# Patient Record
Sex: Female | Born: 1970 | Race: Black or African American | Hispanic: No | Marital: Single | Smoking: Never smoker
Health system: Southern US, Community
[De-identification: ages and names within clinical notes are randomized; demographics above are authoritative.]

## PROBLEM LIST (undated history)

## (undated) DIAGNOSIS — G43009 Migraine without aura, not intractable, without status migrainosus: Principal | ICD-10-CM

## (undated) DIAGNOSIS — D649 Anemia, unspecified: Secondary | ICD-10-CM

## (undated) DIAGNOSIS — N809 Endometriosis, unspecified: Secondary | ICD-10-CM

## (undated) DIAGNOSIS — D259 Leiomyoma of uterus, unspecified: Secondary | ICD-10-CM

## (undated) HISTORY — DX: Migraine without aura, not intractable, without status migrainosus: G43.009

## (undated) HISTORY — DX: Endometriosis, unspecified: N80.9

## (undated) HISTORY — DX: Leiomyoma of uterus, unspecified: D25.9

## (undated) HISTORY — PX: HERNIA REPAIR: SHX51

## (undated) HISTORY — PX: ABDOMINAL EXPLORATION SURGERY: SHX538

---

## 2000-11-07 ENCOUNTER — Emergency Department (HOSPITAL_COMMUNITY): Admission: EM | Admit: 2000-11-07 | Discharge: 2000-11-07 | Payer: Self-pay | Admitting: Emergency Medicine

## 2000-11-07 ENCOUNTER — Encounter: Payer: Self-pay | Admitting: Emergency Medicine

## 2001-04-26 ENCOUNTER — Encounter: Payer: Self-pay | Admitting: Otolaryngology

## 2001-04-26 ENCOUNTER — Ambulatory Visit (HOSPITAL_COMMUNITY): Admission: RE | Admit: 2001-04-26 | Discharge: 2001-04-26 | Payer: Self-pay | Admitting: Otolaryngology

## 2001-10-06 ENCOUNTER — Other Ambulatory Visit: Admission: RE | Admit: 2001-10-06 | Discharge: 2001-10-06 | Payer: Self-pay | Admitting: Obstetrics and Gynecology

## 2002-03-01 ENCOUNTER — Ambulatory Visit (HOSPITAL_COMMUNITY): Admission: RE | Admit: 2002-03-01 | Discharge: 2002-03-01 | Payer: Self-pay | Admitting: Obstetrics and Gynecology

## 2003-03-04 ENCOUNTER — Other Ambulatory Visit: Admission: RE | Admit: 2003-03-04 | Discharge: 2003-03-04 | Payer: Self-pay | Admitting: Obstetrics and Gynecology

## 2010-12-02 ENCOUNTER — Other Ambulatory Visit: Payer: Self-pay | Admitting: Family Medicine

## 2010-12-02 DIAGNOSIS — Z1231 Encounter for screening mammogram for malignant neoplasm of breast: Secondary | ICD-10-CM

## 2010-12-25 ENCOUNTER — Ambulatory Visit
Admission: RE | Admit: 2010-12-25 | Discharge: 2010-12-25 | Disposition: A | Discharge status: Home / Self Care | Payer: Medicaid Other | Source: Ambulatory Visit | Attending: Family Medicine | Admitting: Family Medicine

## 2010-12-25 DIAGNOSIS — Z1231 Encounter for screening mammogram for malignant neoplasm of breast: Secondary | ICD-10-CM

## 2011-03-16 HISTORY — PX: KNEE ARTHROSCOPY: SUR90

## 2011-04-22 ENCOUNTER — Ambulatory Visit: Payer: PRIVATE HEALTH INSURANCE | Attending: Orthopedic Surgery | Admitting: Physical Therapy

## 2011-04-22 DIAGNOSIS — M25569 Pain in unspecified knee: Secondary | ICD-10-CM | POA: Insufficient documentation

## 2011-04-22 DIAGNOSIS — IMO0001 Reserved for inherently not codable concepts without codable children: Secondary | ICD-10-CM | POA: Insufficient documentation

## 2011-04-22 DIAGNOSIS — M25669 Stiffness of unspecified knee, not elsewhere classified: Secondary | ICD-10-CM | POA: Insufficient documentation

## 2011-04-27 ENCOUNTER — Encounter: Payer: PRIVATE HEALTH INSURANCE | Admitting: Rehabilitation

## 2011-05-04 ENCOUNTER — Ambulatory Visit: Payer: PRIVATE HEALTH INSURANCE | Admitting: Rehabilitation

## 2011-05-06 ENCOUNTER — Ambulatory Visit: Payer: PRIVATE HEALTH INSURANCE | Admitting: Rehabilitation

## 2011-05-10 ENCOUNTER — Ambulatory Visit: Payer: PRIVATE HEALTH INSURANCE | Admitting: Rehabilitation

## 2011-05-12 ENCOUNTER — Encounter: Payer: PRIVATE HEALTH INSURANCE | Admitting: Rehabilitation

## 2011-05-14 ENCOUNTER — Ambulatory Visit: Payer: PRIVATE HEALTH INSURANCE | Attending: Orthopedic Surgery | Admitting: Physical Therapy

## 2011-05-14 DIAGNOSIS — M25669 Stiffness of unspecified knee, not elsewhere classified: Secondary | ICD-10-CM | POA: Insufficient documentation

## 2011-05-14 DIAGNOSIS — IMO0001 Reserved for inherently not codable concepts without codable children: Secondary | ICD-10-CM | POA: Insufficient documentation

## 2011-05-14 DIAGNOSIS — M25569 Pain in unspecified knee: Secondary | ICD-10-CM | POA: Insufficient documentation

## 2011-05-24 ENCOUNTER — Ambulatory Visit: Payer: PRIVATE HEALTH INSURANCE | Admitting: Physical Therapy

## 2011-05-27 ENCOUNTER — Ambulatory Visit: Payer: PRIVATE HEALTH INSURANCE | Admitting: Physical Therapy

## 2011-06-01 ENCOUNTER — Ambulatory Visit: Payer: PRIVATE HEALTH INSURANCE | Admitting: Rehabilitation

## 2011-06-04 ENCOUNTER — Ambulatory Visit: Payer: PRIVATE HEALTH INSURANCE | Admitting: Physical Therapy

## 2011-06-08 ENCOUNTER — Encounter: Payer: PRIVATE HEALTH INSURANCE | Admitting: Rehabilitation

## 2011-06-09 ENCOUNTER — Ambulatory Visit: Payer: PRIVATE HEALTH INSURANCE | Admitting: Rehabilitation

## 2011-06-10 ENCOUNTER — Encounter: Payer: PRIVATE HEALTH INSURANCE | Admitting: Rehabilitation

## 2011-06-11 ENCOUNTER — Encounter: Payer: PRIVATE HEALTH INSURANCE | Admitting: Physical Therapy

## 2011-06-14 ENCOUNTER — Ambulatory Visit: Payer: PRIVATE HEALTH INSURANCE | Attending: Orthopedic Surgery | Admitting: Rehabilitation

## 2011-06-14 DIAGNOSIS — M25569 Pain in unspecified knee: Secondary | ICD-10-CM | POA: Insufficient documentation

## 2011-06-14 DIAGNOSIS — IMO0001 Reserved for inherently not codable concepts without codable children: Secondary | ICD-10-CM | POA: Insufficient documentation

## 2011-06-14 DIAGNOSIS — M25669 Stiffness of unspecified knee, not elsewhere classified: Secondary | ICD-10-CM | POA: Insufficient documentation

## 2011-06-16 ENCOUNTER — Encounter: Payer: PRIVATE HEALTH INSURANCE | Admitting: Rehabilitation

## 2011-06-21 ENCOUNTER — Ambulatory Visit: Payer: PRIVATE HEALTH INSURANCE | Admitting: Physical Therapy

## 2011-06-23 ENCOUNTER — Encounter: Payer: PRIVATE HEALTH INSURANCE | Admitting: Rehabilitation

## 2011-06-25 ENCOUNTER — Encounter: Payer: PRIVATE HEALTH INSURANCE | Admitting: Physical Therapy

## 2011-07-02 ENCOUNTER — Ambulatory Visit: Payer: PRIVATE HEALTH INSURANCE | Admitting: Physical Therapy

## 2012-08-03 ENCOUNTER — Other Ambulatory Visit: Payer: Self-pay | Admitting: Physician Assistant

## 2012-08-25 DIAGNOSIS — J302 Other seasonal allergic rhinitis: Secondary | ICD-10-CM | POA: Insufficient documentation

## 2012-09-06 ENCOUNTER — Other Ambulatory Visit: Payer: Self-pay | Admitting: Obstetrics and Gynecology

## 2012-09-06 DIAGNOSIS — Z1231 Encounter for screening mammogram for malignant neoplasm of breast: Secondary | ICD-10-CM

## 2012-09-06 DIAGNOSIS — Z8742 Personal history of other diseases of the female genital tract: Secondary | ICD-10-CM | POA: Insufficient documentation

## 2012-09-19 ENCOUNTER — Ambulatory Visit
Admission: RE | Admit: 2012-09-19 | Discharge: 2012-09-19 | Disposition: A | Discharge status: Home / Self Care | Payer: Medicaid Other | Source: Ambulatory Visit | Attending: Obstetrics and Gynecology | Admitting: Obstetrics and Gynecology

## 2012-09-19 DIAGNOSIS — Z1231 Encounter for screening mammogram for malignant neoplasm of breast: Secondary | ICD-10-CM

## 2013-03-01 ENCOUNTER — Emergency Department (HOSPITAL_COMMUNITY)
Admission: EM | Admit: 2013-03-01 | Discharge: 2013-03-01 | Disposition: A | Discharge status: Home / Self Care | Payer: Medicaid Other | Source: Home / Self Care | Attending: Family Medicine | Admitting: Family Medicine

## 2013-03-01 ENCOUNTER — Encounter (HOSPITAL_COMMUNITY): Payer: Self-pay | Admitting: Emergency Medicine

## 2013-03-01 DIAGNOSIS — M25531 Pain in right wrist: Secondary | ICD-10-CM

## 2013-03-01 DIAGNOSIS — M25539 Pain in unspecified wrist: Secondary | ICD-10-CM

## 2013-03-01 MED ORDER — PREDNISONE 10 MG PO KIT: PACK | ORAL | Status: DC

## 2013-03-01 MED ORDER — TRAMADOL HCL 50 MG PO TABS: 50.0000 mg | ORAL_TABLET | Freq: Four times a day (QID) | ORAL | Status: DC | PRN

## 2013-03-01 NOTE — ED Notes (Signed)
Pt  Reports  Pain   r       Hand        X      1  Day   -  Went  To  A  Provider  yest  And  Was  RX  meloxicam         -  She   denys   specefic  Injury  The  Hand  Is  Slightly  Swollen    And  Seems  Slightly  Red

## 2013-03-01 NOTE — ED Provider Notes (Signed)
CSN: [id number]     Arrival date & time 03/01/13  7829 History   First MD Initiated Contact with Patient 03/01/13 0901     Chief Complaint  Patient presents with  . Hand Problem   (Consider location/radiation/quality/duration/timing/severity/associated sxs/prior Treatment) HPI Comments: Robin Blackwell is a 42 year old female who presents for evaluation of pain, redness, and swelling in the dorsum of her right hand and wrist. This began 2 days ago. She woke up with pain in the wrist today after performing an exercise that involved her spending a prolonged amount of time in the pushup position. The area is swollen and very tender, and the pain is significantly increased with any motion of the hand or wrist. She also has some tingling sensation proximally and distally. She was seen and [facility] urgent [facility] yesterday and was prescribed meloxicam, this does not seem to be helping at all. She had an x-ray that was negative. She has never had this problem before. She denies any history of arthritis, and no immediate family members with rheumatologic diseases. She has a possible history of a ganglion cyst in this area that now hurts. No fever, chills, NVD, or any other systemic symptoms at this time.   History reviewed. No pertinent past medical history. History reviewed. No pertinent past surgical history. History reviewed. No pertinent family history. History  Substance Use Topics  . Smoking status: Never Smoker   . Smokeless tobacco: Not on file  . Alcohol Use: No   OB History   Grav Para Term Preterm Abortions TAB SAB Ect Mult Living                 Review of Systems  Constitutional: Negative for fever and chills.  Eyes: Negative for visual disturbance.  Respiratory: Negative for cough and shortness of breath.   Cardiovascular: Negative for chest pain, palpitations and leg swelling.  Gastrointestinal: Negative for nausea, vomiting and abdominal pain.  Endocrine: Negative for polydipsia and  polyuria.  Genitourinary: Negative for dysuria, urgency and frequency.  Musculoskeletal:       See history of present illness  Skin: Negative for rash.  Neurological: Negative for dizziness, weakness and light-headedness.    Allergies  Review of patient's allergies indicates no known allergies.  Home Medications   Current Outpatient Rx  Name  Route  Sig  Dispense  Refill  . MELOXICAM PO   Oral   Take by mouth.         . PredniSONE 10 MG KIT      12 day taper dose pack. Use as directed   48 each   0   . traMADol (ULTRAM) 50 MG tablet   Oral   Take 1 tablet (50 mg total) by mouth every 6 (six) hours as needed.   20 tablet   0    BP 120/76  Pulse 72  Temp(Src) 98.6 F (37 C) (Oral)  Resp 18  SpO2 100%  LMP 02/09/2013 Physical Exam  Nursing note and vitals reviewed. Constitutional: She is oriented to person, place, and time. Vital signs are normal. She appears well-developed and well-nourished. No distress.  HENT:  Head: Normocephalic and atraumatic.  Pulmonary/Chest: Effort normal. No respiratory distress.  Musculoskeletal:       Right wrist: She exhibits decreased range of motion (severe pain on the dorsum of the wrist with flexion of the wrist) and swelling.       Right hand: She exhibits decreased range of motion, tenderness and swelling. She exhibits normal two-point  discrimination. Normal sensation noted. Decreased strength (due to pain) noted.       Hands: Neurological: She is alert and oriented to person, place, and time. She has normal strength. Coordination normal.  Skin: Skin is warm and dry. No rash noted. She is not diaphoretic.  Psychiatric: She has a normal mood and affect. Judgment normal.    ED Course  Procedures (including critical care time) Labs Review Labs Reviewed - No data to display Imaging Review No results found.  Ultrasound done by Dr. Denyse Amass did not reveal any obvious cause of this patient's pain  MDM   1. Wrist pain, acute,  right    Probable overuse injury of the wrist. Treating with prednisone, immobilization, tramadol, when necessary Tylenol. Followup with Dr. Katrinka Blazing[facility]   Meds ordered this encounter  Medications  . PredniSONE 10 MG KIT    Sig: 12 day taper dose pack. Use as directed    Dispense:  48 each    Refill:  0    Order Specific Question:  Supervising Provider    Answer:  Clementeen Graham, S [[id number]]  . traMADol (ULTRAM) 50 MG tablet    Sig: Take 1 tablet (50 mg total) by mouth every 6 (six) hours as needed.    Dispense:  20 tablet    Refill:  0    Order Specific Question:  Supervising Provider    Answer:  Clementeen Graham, Kathie Rhodes [[id number]]       Graylon Good, PA-C 03/01/13 [phone]

## 2013-03-01 NOTE — ED Notes (Signed)
Med  r  Thumb  Spica   applied 

## 2013-03-03 NOTE — ED Provider Notes (Signed)
Medical screening examination/treatment/procedure(s) were performed by a resident physician or non-physician practitioner and as the supervising physician I was immediately available for consultation/collaboration.  Clementeen Graham, MD    Rodolph Bong, MD 03/03/13 [phone]

## 2013-08-30 ENCOUNTER — Other Ambulatory Visit: Payer: Self-pay

## 2013-08-30 DIAGNOSIS — Z1231 Encounter for screening mammogram for malignant neoplasm of breast: Secondary | ICD-10-CM

## 2013-09-07 ENCOUNTER — Emergency Department (HOSPITAL_COMMUNITY): Payer: Medicaid Other

## 2013-09-07 ENCOUNTER — Encounter (HOSPITAL_COMMUNITY): Payer: Self-pay | Admitting: Emergency Medicine

## 2013-09-07 ENCOUNTER — Emergency Department (HOSPITAL_COMMUNITY)
Admission: EM | Admit: 2013-09-07 | Discharge: 2013-09-07 | Disposition: A | Discharge status: Home / Self Care | Payer: Self-pay | Attending: Emergency Medicine | Admitting: Emergency Medicine

## 2013-09-07 DIAGNOSIS — Z8679 Personal history of other diseases of the circulatory system: Secondary | ICD-10-CM | POA: Insufficient documentation

## 2013-09-07 DIAGNOSIS — Z3202 Encounter for pregnancy test, result negative: Secondary | ICD-10-CM | POA: Insufficient documentation

## 2013-09-07 DIAGNOSIS — R51 Headache: Secondary | ICD-10-CM | POA: Insufficient documentation

## 2013-09-07 DIAGNOSIS — R519 Headache, unspecified: Secondary | ICD-10-CM

## 2013-09-07 DIAGNOSIS — M62838 Other muscle spasm: Secondary | ICD-10-CM | POA: Insufficient documentation

## 2013-09-07 DIAGNOSIS — IMO0002 Reserved for concepts with insufficient information to code with codable children: Secondary | ICD-10-CM | POA: Insufficient documentation

## 2013-09-07 DIAGNOSIS — R110 Nausea: Secondary | ICD-10-CM | POA: Insufficient documentation

## 2013-09-07 DIAGNOSIS — H53149 Visual discomfort, unspecified: Secondary | ICD-10-CM | POA: Insufficient documentation

## 2013-09-07 DIAGNOSIS — M542 Cervicalgia: Secondary | ICD-10-CM | POA: Insufficient documentation

## 2013-09-07 LAB — I-STAT CHEM 8, ED
BUN: 13 mg/dL (ref 6–23)
Calcium, Ion: 1.15 mmol/L (ref 1.12–1.23)
Chloride: 113 mEq/L — ABNORMAL HIGH (ref 96–112)
Creatinine, Ser: 0.8 mg/dL (ref 0.50–1.10)
Glucose, Bld: 74 mg/dL (ref 70–99)
HCT: 38 % (ref 36.0–46.0)
Hemoglobin: 12.9 g/dL (ref 12.0–15.0)
Potassium: 3.3 mEq/L — ABNORMAL LOW (ref 3.7–5.3)
Sodium: 138 mEq/L (ref 137–147)
TCO2: 26 mmol/L (ref 0–100)

## 2013-09-07 LAB — CBC WITH DIFFERENTIAL/PLATELET
Basophils Absolute: 0 10*3/uL (ref 0.0–0.1)
Basophils Relative: 1 % (ref 0–1)
Eosinophils Absolute: 0.1 10*3/uL (ref 0.0–0.7)
Eosinophils Relative: 2 % (ref 0–5)
HCT: 38.5 % (ref 36.0–46.0)
Hemoglobin: 12.4 g/dL (ref 12.0–15.0)
Lymphocytes Relative: 29 % (ref 12–46)
Lymphs Abs: 1.7 10*3/uL (ref 0.7–4.0)
MCH: 30.4 pg (ref 26.0–34.0)
MCHC: 32.2 g/dL (ref 30.0–36.0)
MCV: 94.4 fL (ref 78.0–100.0)
Monocytes Absolute: 0.4 10*3/uL (ref 0.1–1.0)
Monocytes Relative: 6 % (ref 3–12)
Neutro Abs: 3.6 10*3/uL (ref 1.7–7.7)
Neutrophils Relative %: 62 % (ref 43–77)
Platelets: 203 10*3/uL (ref 150–400)
RBC: 4.08 MIL/uL (ref 3.87–5.11)
RDW: 13.3 % (ref 11.5–15.5)
WBC: 5.8 10*3/uL (ref 4.0–10.5)

## 2013-09-07 LAB — I-STAT CG4 LACTIC ACID, ED: Lactic Acid, Venous: 1.33 mmol/L (ref 0.5–2.2)

## 2013-09-07 LAB — POC URINE PREG, ED: Preg Test, Ur: NEGATIVE

## 2013-09-07 MED ORDER — IOHEXOL 350 MG/ML SOLN
80.0000 mL | Freq: Once | INTRAVENOUS | Status: AC | PRN
  Administered 2013-09-07: 80 mL via INTRAVENOUS

## 2013-09-07 MED ORDER — HYDROMORPHONE HCL PF 1 MG/ML IJ SOLN
1.0000 mg | Freq: Once | INTRAMUSCULAR | Status: AC
  Administered 2013-09-07: 1 mg via INTRAVENOUS
  Filled 2013-09-07: qty 1

## 2013-09-07 MED ORDER — METOCLOPRAMIDE HCL 5 MG/ML IJ SOLN
10.0000 mg | Freq: Once | INTRAMUSCULAR | Status: AC
  Administered 2013-09-07: 10 mg via INTRAVENOUS
  Filled 2013-09-07: qty 2

## 2013-09-07 MED ORDER — OXYCODONE-ACETAMINOPHEN 5-325 MG PO TABS: 1.0000 | ORAL_TABLET | ORAL | Status: DC | PRN

## 2013-09-07 MED ORDER — ONDANSETRON HCL 4 MG/2ML IJ SOLN
4.0000 mg | Freq: Once | INTRAMUSCULAR | Status: AC
  Administered 2013-09-07: 4 mg via INTRAVENOUS
  Filled 2013-09-07: qty 2

## 2013-09-07 MED ORDER — DIPHENHYDRAMINE HCL 50 MG/ML IJ SOLN
25.0000 mg | Freq: Once | INTRAMUSCULAR | Status: AC
  Administered 2013-09-07: 25 mg via INTRAVENOUS
  Filled 2013-09-07: qty 1

## 2013-09-07 MED ORDER — DIAZEPAM 5 MG PO TABS: 5.0000 mg | ORAL_TABLET | Freq: Three times a day (TID) | ORAL | Status: DC | PRN

## 2013-09-07 MED ORDER — LIDOCAINE 5 % EX PTCH
2.0000 | MEDICATED_PATCH | CUTANEOUS | Status: DC
  Administered 2013-09-07: 2 via TRANSDERMAL
  Filled 2013-09-07: qty 2

## 2013-09-07 MED ORDER — DIAZEPAM 5 MG/ML IJ SOLN
5.0000 mg | Freq: Once | INTRAMUSCULAR | Status: AC
  Administered 2013-09-07: 5 mg via INTRAVENOUS
  Filled 2013-09-07: qty 2

## 2013-09-07 MED ORDER — ONDANSETRON 4 MG PO TBDP: 4.0000 mg | ORAL_TABLET | Freq: Once | ORAL | Status: DC

## 2013-09-07 MED ORDER — LIDOCAINE 5 % EX PTCH: 1.0000 | MEDICATED_PATCH | CUTANEOUS | Status: DC

## 2013-09-07 MED ORDER — IBUPROFEN 600 MG PO TABS: 600.0000 mg | ORAL_TABLET | Freq: Four times a day (QID) | ORAL | Status: DC | PRN

## 2013-09-07 NOTE — ED Notes (Signed)
Reviewed discharge instructionswith patient and family. Pt. Denies questions and further needs at this time.

## 2013-09-07 NOTE — Discharge Instructions (Signed)
Please follow up with your primary care physician in 1-2 days. If you do not have one please call the [place] number listed above. Please follow up with Dr. Jannifer Franklin to schedule a follow up appointment.  Please take pain medication and/or muscle relaxants as prescribed and as needed for pain. Please do not drive on narcotic pain medication or on muscle relaxants. Please return if you develop a fever > 100.62F, nausea, vomiting, lethargy, slurred speech, or other concerning signs or symptoms. Please read all discharge instructions and return precautions.   General Headache Without Cause A headache is pain or discomfort felt around the head or neck area. The specific cause of a headache may not be found. There are many causes and types of headaches. A few common ones are:  Tension headaches.  Migraine headaches.  Cluster headaches.  Chronic daily headaches. HOME CARE INSTRUCTIONS   Keep all follow-up appointments with your caregiver or any specialist referral.  Only take over-the-counter or prescription medicines for pain or discomfort as directed by your caregiver.  Lie down in a dark, quiet room when you have a headache.  Keep a headache journal to find out what may trigger your migraine headaches. For example, write down:  What you eat and drink.  How much sleep you get.  Any change to your diet or medicines.  Try massage or other relaxation techniques.  Put ice packs or heat on the head and neck. Use these 3 to 4 times per day for 15 to 20 minutes each time, or as needed.  Limit stress.  Sit up straight, and do not tense your muscles.  Quit smoking if you smoke.  Limit alcohol use.  Decrease the amount of caffeine you drink, or stop drinking caffeine.  Eat and sleep on a regular schedule.  Get 7 to 9 hours of sleep, or as recommended by your caregiver.  Keep lights dim if bright lights bother you and make your headaches worse. SEEK MEDICAL CARE  IF:   You have problems with the medicines you were prescribed.  Your medicines are not working.  You have a change from the usual headache.  You have nausea or vomiting. SEEK IMMEDIATE MEDICAL CARE IF:   Your headache becomes severe.  You have a fever.  You have a stiff neck.  You have loss of vision.  You have muscular weakness or loss of muscle control.  You start losing your balance or have trouble walking.  You feel faint or pass out.  You have severe symptoms that are different from your first symptoms. MAKE SURE YOU:   Understand these instructions.  Will watch your condition.  Will get help right away if you are not doing well or get worse. Document Released: 03/01/2005 Document Revised: 05/24/2011 Document Reviewed: 03/17/2011 [facility] Patient Information 2015 [place], [place]. This information is not intended to replace advice given to you by your health care provider. Make sure you discuss any questions you have with your health care provider.   Headaches, Frequently Asked Questions MIGRAINE HEADACHES Q: What is migraine? What causes it? How can I treat it? A: Generally, migraine headaches begin as a dull ache. Then they develop into a constant, throbbing, and pulsating pain. You may experience pain at the temples. You may experience pain at the front or back of one or both sides of the head. The pain is usually accompanied by a combination of:  Nausea.  Vomiting.  Sensitivity to light and noise. Some people (  about 15%) experience an aura (see below) before an attack. The cause of migraine is believed to be chemical reactions in the brain. Treatment for migraine may include over-the-counter or prescription medications. It may also include self-help techniques. These include relaxation training and biofeedback.  Q: What is an aura? A: About 15% of people with migraine get an "aura". This is a sign of neurological symptoms that occur before a migraine  headache. You may see wavy or jagged lines, dots, or flashing lights. You might experience tunnel vision or blind spots in one or both eyes. The aura can include visual or auditory hallucinations (something imagined). It may include disruptions in smell (such as strange odors), taste or touch. Other symptoms include:  Numbness.  A "pins and needles" sensation.  Difficulty in recalling or speaking the correct word. These neurological events may last as long as 60 minutes. These symptoms will fade as the headache begins. Q: What is a trigger? A: Certain physical or environmental factors can lead to or "trigger" a migraine. These include:  Foods.  Hormonal changes.  Weather.  Stress. It is important to remember that triggers are different for everyone. To help prevent migraine attacks, you need to figure out which triggers affect you. Keep a headache diary. This is a good way to track triggers. The diary will help you talk to your healthcare professional about your condition. Q: Does weather affect migraines? A: Bright sunshine, hot, humid conditions, and drastic changes in barometric pressure may lead to, or "trigger," a migraine attack in some people. But studies have shown that weather does not act as a trigger for everyone with migraines. Q: What is the link between migraine and hormones? A: Hormones start and regulate many of your body's functions. Hormones keep your body in balance within a constantly changing environment. The levels of hormones in your body are unbalanced at times. Examples are during menstruation, pregnancy, or menopause. That can lead to a migraine attack. In fact, about three quarters of all women with migraine report that their attacks are related to the menstrual cycle.  Q: Is there an increased risk of stroke for migraine sufferers? A: The likelihood of a migraine attack causing a stroke is very remote. That is not to say that migraine sufferers cannot have a stroke  associated with their migraines. In persons under age 68, the most common associated factor for stroke is migraine headache. But over the course of a person's normal life span, the occurrence of migraine headache may actually be associated with a reduced risk of dying from cerebrovascular disease due to stroke.  Q: What are acute medications for migraine? A: Acute medications are used to treat the pain of the headache after it has started. Examples over-the-counter medications, NSAIDs, ergots, and triptans.  Q: What are the triptans? A: Triptans are the newest class of abortive medications. They are specifically targeted to treat migraine. Triptans are vasoconstrictors. They moderate some chemical reactions in the brain. The triptans work on receptors in your brain. Triptans help to restore the balance of a neurotransmitter called serotonin. Fluctuations in levels of serotonin are thought to be a main cause of migraine.  Q: Are over-the-counter medications for migraine effective? A: Over-the-counter, or "OTC," medications may be effective in relieving mild to moderate pain and associated symptoms of migraine. But you should see your caregiver before beginning any treatment regimen for migraine.  Q: What are preventive medications for migraine? A: Preventive medications for migraine are sometimes referred to as "  prophylactic" treatments. They are used to reduce the frequency, severity, and length of migraine attacks. Examples of preventive medications include antiepileptic medications, antidepressants, beta-blockers, calcium channel blockers, and NSAIDs (nonsteroidal anti-inflammatory drugs). Q: Why are anticonvulsants used to treat migraine? A: During the past few years, there has been an increased interest in antiepileptic drugs for the prevention of migraine. They are sometimes referred to as "anticonvulsants". Both epilepsy and migraine may be caused by similar reactions in the brain.  Q: Why are  antidepressants used to treat migraine? A: Antidepressants are typically used to treat people with depression. They may reduce migraine frequency by regulating chemical levels, such as serotonin, in the brain.  Q: What alternative therapies are used to treat migraine? A: The term "alternative therapies" is often used to describe treatments considered outside the scope of conventional Western medicine. Examples of alternative therapy include acupuncture, acupressure, and yoga. Another common alternative treatment is herbal therapy. Some herbs are believed to relieve headache pain. Always discuss alternative therapies with your caregiver before proceeding. Some herbal products contain arsenic and other toxins. TENSION HEADACHES Q: What is a tension-type headache? What causes it? How can I treat it? A: Tension-type headaches occur randomly. They are often the result of temporary stress, anxiety, fatigue, or anger. Symptoms include soreness in your temples, a tightening band-like sensation around your head (a "vice-like" ache). Symptoms can also include a pulling feeling, pressure sensations, and contracting head and neck muscles. The headache begins in your forehead, temples, or the back of your head and neck. Treatment for tension-type headache may include over-the-counter or prescription medications. Treatment may also include self-help techniques such as relaxation training and biofeedback. CLUSTER HEADACHES Q: What is a cluster headache? What causes it? How can I treat it? A: Cluster headache gets its name because the attacks come in groups. The pain arrives with little, if any, warning. It is usually on one side of the head. A tearing or bloodshot eye and a runny nose on the same side of the headache may also accompany the pain. Cluster headaches are believed to be caused by chemical reactions in the brain. They have been described as the most severe and intense of any headache type. Treatment for cluster  headache includes prescription medication and oxygen. SINUS HEADACHES Q: What is a sinus headache? What causes it? How can I treat it? A: When a cavity in the bones of the face and skull (a sinus) becomes inflamed, the inflammation will cause localized pain. This condition is usually the result of an allergic reaction, a tumor, or an infection. If your headache is caused by a sinus blockage, such as an infection, you will probably have a fever. An x-ray will confirm a sinus blockage. Your caregiver's treatment might include antibiotics for the infection, as well as antihistamines or decongestants.  REBOUND HEADACHES Q: What is a rebound headache? What causes it? How can I treat it? A: A pattern of taking acute headache medications too often can lead to a condition known as "rebound headache." A pattern of taking too much headache medication includes taking it more than 2 days per week or in excessive amounts. That means more than the label or a caregiver advises. With rebound headaches, your medications not only stop relieving pain, they actually begin to cause headaches. Doctors treat rebound headache by tapering the medication that is being overused. Sometimes your caregiver will gradually substitute a different type of treatment or medication. Stopping may be a challenge. Regularly overusing a medication  increases the potential for serious side effects. Consult a caregiver if you regularly use headache medications more than 2 days per week or more than the label advises. ADDITIONAL QUESTIONS AND ANSWERS Q: What is biofeedback? A: Biofeedback is a self-help treatment. Biofeedback uses special equipment to monitor your body's involuntary physical responses. Biofeedback monitors:  Breathing.  Pulse.  Heart rate.  Temperature.  Muscle tension.  Brain activity. Biofeedback helps you refine and perfect your relaxation exercises. You learn to control the physical responses that are related to  stress. Once the technique has been mastered, you do not need the equipment any more. Q: Are headaches hereditary? A: Four out of five (80%) of people that suffer report a family history of migraine. Scientists are not sure if this is genetic or a family predisposition. Despite the uncertainty, a child has a 50% chance of having migraine if one parent suffers. The child has a 75% chance if both parents suffer.  Q: Can children get headaches? A: By the time they reach high school, most young people have experienced some type of headache. Many safe and effective approaches or medications can prevent a headache from occurring or stop it after it has begun.  Q: What type of doctor should I see to diagnose and treat my headache? A: Start with your primary caregiver. Discuss his or her experience and approach to headaches. Discuss methods of classification, diagnosis, and treatment. Your caregiver may decide to recommend you to a headache specialist, depending upon your symptoms or other physical conditions. Having diabetes, allergies, etc., may require a more comprehensive and inclusive approach to your headache. The National Head[employer] Foundation will provide, upon request, a list of [facility] physician members in your state. Document Released: 05/22/2003 Document Revised: 05/24/2011 Document Reviewed: 10/30/2007 [facility] Patient Information 2015 [place], [place]. This information is not intended to replace advice given to you by your health care provider. Make sure you discuss any questions you have with your health care provider.

## 2013-09-07 NOTE — ED Notes (Signed)
Headache for 2-3 weeks-- shoulder and neck pain, was seen at [facility] in [place], received 4 shots states with no relief. Dr. Jonni Sanger is primary care doc.

## 2013-09-07 NOTE — ED Provider Notes (Signed)
CSN: [id number]     Arrival date & time 09/07/13  3833 History   First MD Initiated Contact with Patient 09/07/13 1013     Chief Complaint  Patient presents with  . Headache  . Torticollis     (Consider location/radiation/quality/duration/timing/severity/associated sxs/prior Treatment) HPI Comments: Patient is a 43 yo F PMHx significant for migraines presenting to the ED for 2-3 week history of throbbing tight pain that began in the patient's right shoulder blade area and neck that has been gradually radiating up into the posterior head causing a headache. Patient states she has had a continued throbbing headache without relief. Patient was seen by her PCP 2 days ago and had a CT head w/o contrast performed without acute finding. She states she was given a shot of Benadryl and Stadol with no improvement. Was advised to come to the emergency department for continued headache. Patient has been seen by a chiropractor and receiving adjustments on her right shoulder and neck, last visit was Monday. Denies any fevers or chills. Does endorse associated nausea and photophobia.  Patient is a 43 y.o. female presenting with headaches.  Headache Associated symptoms: nausea and photophobia   Associated symptoms: no abdominal pain, no fever, no numbness and no vomiting     Past Medical History  Diagnosis Date  . Migraines     around period time   Past Surgical History  Procedure Laterality Date  . Knee arthroscopy  2013  . Hernia repair    . Abdominal exploration surgery     No family history on file. History  Substance Use Topics  . Smoking status: Never Smoker   . Smokeless tobacco: Not on file  . Alcohol Use: No   OB History   Grav Para Term Preterm Abortions TAB SAB Ect Mult Living                 Review of Systems  Constitutional: Negative for fever and chills.  Eyes: Positive for photophobia.  Respiratory: Negative for shortness of breath.   Cardiovascular: Negative for chest  pain.  Gastrointestinal: Positive for nausea. Negative for vomiting and abdominal pain.  Neurological: Positive for headaches. Negative for syncope, weakness and numbness.  All other systems reviewed and are negative.     Allergies  Review of patient's allergies indicates no known allergies.  Home Medications   Prior to Admission medications   Medication Sig Start Date End Date Taking? Authorizing Provider  diazepam (VALIUM) 5 MG tablet Take 1 tablet (5 mg total) by mouth every 8 (eight) hours as needed for muscle spasms. 09/07/13   Ian Castagna L Diannie Willner, PA-C  ibuprofen (ADVIL,MOTRIN) 600 MG tablet Take 1 tablet (600 mg total) by mouth every 6 (six) hours as needed. 09/07/13   Aqua Denslow L Vinie Charity, PA-C  lidocaine (LIDODERM) 5 % Place 1 patch onto the skin daily. Remove & Discard patch within 12 hours or as directed by MD 09/07/13   Stephani Police Arius Harnois, PA-C  MELOXICAM PO Take by mouth.    Historical Provider, MD  oxyCODONE-acetaminophen (PERCOCET/ROXICET) 5-325 MG per tablet Take 1 tablet by mouth every 4 (four) hours as needed for severe pain. May take 2 tablets PO q 6 hours for severe pain - Do not take with Tylenol as this tablet already contains tylenol 09/07/13   Rocko Fesperman L Namiko Pritts, PA-C  PredniSONE 10 MG KIT 12 day taper dose pack. Use as directed 03/01/13   Liam Graham, PA-C  traMADol (ULTRAM) 50 MG tablet Take 1 tablet (50  mg total) by mouth every 6 (six) hours as needed. 03/01/13   Freeman Caldron Baker, PA-C   BP 141/85  Pulse 71  Temp(Src) 98.1 F (36.7 C) (Oral)  Resp 17  SpO2 100%  LMP 08/20/2013 Physical Exam  Nursing note and vitals reviewed. Constitutional: She is oriented to person, place, and time. She appears well-developed and well-nourished. No distress.  HENT:  Head: Normocephalic and atraumatic.  Right Ear: Hearing, tympanic membrane, external ear and ear canal normal.  Left Ear: Hearing, tympanic membrane, external ear and ear canal normal.  Nose:  Nose normal.  Mouth/Throat: Uvula is midline, oropharynx is clear and moist and mucous membranes are normal. No oropharyngeal exudate.  Eyes: Conjunctivae and EOM are normal. Pupils are equal, round, and reactive to light.  Neck: Normal range of motion and full passive range of motion without pain. Neck supple. Muscular tenderness present. No spinous process tenderness present. No rigidity. No edema and no erythema present. No Brudzinski's sign and no Kernig's sign noted.    Cardiovascular: Normal rate, regular rhythm, normal heart sounds and intact distal pulses.   Pulmonary/Chest: Effort normal and breath sounds normal. No respiratory distress.  Abdominal: Soft. There is no tenderness.  Neurological: She is alert and oriented to person, place, and time. She has normal strength. No cranial nerve deficit. Gait normal. GCS eye subscore is 4. GCS verbal subscore is 5. GCS motor subscore is 6.  Sensation grossly intact.  No pronator drift.  Bilateral heel-knee-shin intact.  Skin: Skin is warm and dry. She is not diaphoretic.    ED Course  Procedures (including critical care time) Medications  lidocaine (LIDODERM) 5 % 2 patch (not administered)  ondansetron (ZOFRAN) injection 4 mg (4 mg Intravenous Given 09/07/13 1128)  diphenhydrAMINE (BENADRYL) injection 25 mg (25 mg Intravenous Given 09/07/13 1129)  metoCLOPramide (REGLAN) injection 10 mg (10 mg Intravenous Given 09/07/13 1130)  diazepam (VALIUM) injection 5 mg (5 mg Intravenous Given 09/07/13 1132)  HYDROmorphone (DILAUDID) injection 1 mg (1 mg Intravenous Given 09/07/13 1331)  iohexol (OMNIPAQUE) 350 MG/ML injection 80 mL (80 mLs Intravenous Contrast Given 09/07/13 1519)  HYDROmorphone (DILAUDID) injection 1 mg (1 mg Intravenous Given 09/07/13 1600)    Labs Review Labs Reviewed  I-STAT CHEM 8, ED - Abnormal; Notable for the following:    Potassium 3.3 (*)    Chloride 113 (*)    All other components within normal limits  CSF CULTURE    GRAM STAIN  CBC WITH DIFFERENTIAL  PROTIME-INR  CSF CELL COUNT WITH DIFFERENTIAL  CSF CELL COUNT WITH DIFFERENTIAL  PROTEIN AND GLUCOSE, CSF  I-STAT CG4 LACTIC ACID, ED  POC URINE PREG, ED    Imaging Review Ct Angio Head W/cm &/or Wo Cm  09/07/2013   CLINICAL DATA:  Headache for 2-3 weeks. Shoulder and neck pain. Evaluate for vascular lesion.  EXAM: CT ANGIOGRAPHY HEAD AND NECK  TECHNIQUE: Multidetector CT imaging of the head and neck was performed using the standard protocol during bolus administration of intravenous contrast. Multiplanar CT image reconstructions and MIPs were obtained to evaluate the vascular anatomy. Carotid stenosis measurements (when applicable) are obtained utilizing NASCET criteria, using the distal internal carotid diameter as the denominator.  CONTRAST:  34m OMNIPAQUE IOHEXOL 350 MG/ML SOLN  COMPARISON:  None.  FINDINGS: CTA HEAD FINDINGS  No evidence for acute infarction, hemorrhage, mass lesion, hydrocephalus, or extra-axial fluid. There is no atrophy or white matter disease. Post infusion, no abnormal enhancement of the brain or meninges. Calvarium intact. No  sinus or mastoid disease.  Internal carotid arteries are widely patent. No cavernous or supraclinoid stenosis. No skull base dissection.  Basilar artery widely patent with both vertebrals contributing, left dominant.  Unremarkable anterior, middle, and posterior cerebral arteries. No cerebellar branch occlusion. No visible intracranial aneurysm.  Major dural venous sinuses are patent.  Review of the MIP images confirms the above findings.  CTA NECK FINDINGS  Transverse arch demonstrates conventional branching of the great vessels without proximal stenosis. No visible aortic dissection or mediastinal mass.  The carotid bifurcations are widely patent without atherosclerosis or dissection. No fibromuscular disease.  Normal-appearing bilateral vertebral arteries with the left dominant. No ostial disease. No encroachment  by osseous spurs.  Subcentimeter right inferior thyroid lobe cyst without calcification. No internal jugular vein thrombus. No visible adenopathy. Major and minor salivary glands unremarkable. No sinus disease. No significant cervical spondylosis. No lung apex lesion. No body wall abnormality.  Review of the MIP images confirms the above findings.  IMPRESSION: No extracranial stenosis or dissection. No significant mediastinal, cervicothoracic spinal, or soft tissue neck abnormality.  Unremarkable brain and intracranial circulation. No acute or focal abnormality is detected. No flow-limiting stenosis or occlusion.  No extracranial cause for headache is observed.   Electronically Signed   By: Rolla Flatten M.D.   On: 09/07/2013 15:51   Ct Angio Neck W/cm &/or Wo/cm  09/07/2013   CLINICAL DATA:  Headache for 2-3 weeks. Shoulder and neck pain. Evaluate for vascular lesion.  EXAM: CT ANGIOGRAPHY HEAD AND NECK  TECHNIQUE: Multidetector CT imaging of the head and neck was performed using the standard protocol during bolus administration of intravenous contrast. Multiplanar CT image reconstructions and MIPs were obtained to evaluate the vascular anatomy. Carotid stenosis measurements (when applicable) are obtained utilizing NASCET criteria, using the distal internal carotid diameter as the denominator.  CONTRAST:  28m OMNIPAQUE IOHEXOL 350 MG/ML SOLN  COMPARISON:  None.  FINDINGS: CTA HEAD FINDINGS  No evidence for acute infarction, hemorrhage, mass lesion, hydrocephalus, or extra-axial fluid. There is no atrophy or white matter disease. Post infusion, no abnormal enhancement of the brain or meninges. Calvarium intact. No sinus or mastoid disease.  Internal carotid arteries are widely patent. No cavernous or supraclinoid stenosis. No skull base dissection.  Basilar artery widely patent with both vertebrals contributing, left dominant.  Unremarkable anterior, middle, and posterior cerebral arteries. No cerebellar branch  occlusion. No visible intracranial aneurysm.  Major dural venous sinuses are patent.  Review of the MIP images confirms the above findings.  CTA NECK FINDINGS  Transverse arch demonstrates conventional branching of the great vessels without proximal stenosis. No visible aortic dissection or mediastinal mass.  The carotid bifurcations are widely patent without atherosclerosis or dissection. No fibromuscular disease.  Normal-appearing bilateral vertebral arteries with the left dominant. No ostial disease. No encroachment by osseous spurs.  Subcentimeter right inferior thyroid lobe cyst without calcification. No internal jugular vein thrombus. No visible adenopathy. Major and minor salivary glands unremarkable. No sinus disease. No significant cervical spondylosis. No lung apex lesion. No body wall abnormality.  Review of the MIP images confirms the above findings.  IMPRESSION: No extracranial stenosis or dissection. No significant mediastinal, cervicothoracic spinal, or soft tissue neck abnormality.  Unremarkable brain and intracranial circulation. No acute or focal abnormality is detected. No flow-limiting stenosis or occlusion.  No extracranial cause for headache is observed.   Electronically Signed   By: JRolla FlattenM.D.   On: 09/07/2013 15:51     EKG Interpretation  None      MDM   Final diagnoses:  Nonintractable headache, unspecified chronicity pattern, unspecified headache type  Neck pain, bilateral posterior  Muscle spasms of neck    4:23 PM Discussed LP procedure with patient, discussed risks, benefits and actual procedure with the patient and her mother. Discussed a likely low suspicion for meningitis as patient is afebrile with full range of motion of neck, but discussed that we could not be 100% sure without a lumbar puncture. Had an extensive conversation regarding lumbar puncture with patient, her mother, Dr. Wyvonnia Dusky, and myself. The patient and the mother Acree to hold off on lumbar  puncture at this time, will return for any development of fever, mental status changes or other concerning signs or symptoms.  Filed Vitals:   09/07/13 1556  BP:   Pulse: 71  Temp:   Resp:    Afebrile, NAD, non-toxic appearing, AAOx4.  Pt HA treated and improved while in ED.  Non-concerning for [facility], ICH, Meningitis, or temporal arteritis. Pt is afebrile with no focal neuro deficits, nuchal rigidity, or change in vision. No neuro focal deficits on repeat examinations during stay in the emergency department. Lumbar puncture will be deferred at this time. CT scan is reviewed without any acute finding intracranial concern. Will treat patient as outpatient with symptom management along with neurology outpatient followup. Extensive return precautions were discussed with patient who agrees. Patient is stable at time of discharge Patient d/w with Dr. Wyvonnia Dusky, agrees with plan.    Harlow Mares, PA-C 09/07/13 1643

## 2013-09-07 NOTE — ED Provider Notes (Signed)
Medical screening examination/treatment/procedure(s) were conducted as a shared visit with non-physician practitioner(s) and myself.  I personally evaluated the patient during the encounter.  >3 week history of pain across R upper back and neck, radiating into head.  Denies trauma.  Has seen PCP and chiropractor, had negative CT. No fever, focal weakness, numbness, tingling.  Pain radiates from R neck, diffusely into head.  No vomiting. Patient's mother reports patient is doing better now than in previous weeks but still having pain. Smiling and nontoxic, afebrile.  TTP R paraspinal muscles.  No meningismus, neck supple. CN 2-12 intact, no ataxia on finger to nose, no nystagmus, 5/5 strength throughout, no pronator drift, Romberg negative, normal gait. Check CTA given recent chiropractor manipulation. LP discussed with patient and mother.  Patient has been having pain for >3 weeks. No fever, no meningismus, nonfocal neuro exam.  No leukocytosis.  Discussed with patient that meningitis seems unlikely given her prolonged course of symptoms and nontoxic appearance with normal neuro exam, no fever, no WBC count, no meningismus.  She and mother defer LP at this time and understands that meningitis cannot be 100% ruled out without it. She understands to return with fever, confusion, neuro deficit or any other concern.  BP 138/93  Pulse 83  Temp(Src) 98.1 F (36.7 C) (Oral)  Resp 19  SpO2 100%  LMP 08/20/2013      EKG Interpretation None       Ezequiel Essex, MD 09/07/13 2005

## 2013-09-07 NOTE — ED Notes (Signed)
Pt had been treated for right ear infection with amoxicillan, pain started in left shoulder, saw chiropractor approx 6 times receiving manipulation, and accupuncture. Last visit at chiropractor--Monday. Uses a tens machine on shoulder for pain, no relief,. Continues to have severe right ear pain, with throbbing.

## 2013-09-07 NOTE — ED Notes (Signed)
IV team attempted IV x 1 with ultrasound without success--

## 2013-09-07 NOTE — ED Notes (Signed)
Phlebotomy at bedside.

## 2013-09-20 ENCOUNTER — Ambulatory Visit
Admission: RE | Admit: 2013-09-20 | Discharge: 2013-09-20 | Disposition: A | Discharge status: Home / Self Care | Payer: BC Managed Care – PPO | Source: Ambulatory Visit

## 2013-09-20 DIAGNOSIS — Z1231 Encounter for screening mammogram for malignant neoplasm of breast: Secondary | ICD-10-CM

## 2013-09-25 ENCOUNTER — Encounter: Payer: Self-pay | Admitting: Neurology

## 2013-09-25 ENCOUNTER — Ambulatory Visit (INDEPENDENT_AMBULATORY_CARE_PROVIDER_SITE_OTHER): Payer: BC Managed Care – PPO | Admitting: Neurology

## 2013-09-25 VITALS — BP 122/85 | HR 73 | Ht 68.0 in | Wt 168.0 lb

## 2013-09-25 DIAGNOSIS — S139XXA Sprain of joints and ligaments of unspecified parts of neck, initial encounter: Secondary | ICD-10-CM | POA: Insufficient documentation

## 2013-09-25 DIAGNOSIS — G43009 Migraine without aura, not intractable, without status migrainosus: Secondary | ICD-10-CM

## 2013-09-25 HISTORY — DX: Migraine without aura, not intractable, without status migrainosus: G43.009

## 2013-09-25 NOTE — Patient Instructions (Addendum)
Topamax (topiramate) is a seizure medication that has an FDA approval for seizures and for migraine headache. Potential side effects of this medication include weight loss, cognitive slowing, tingling in the fingers and toes, and carbonated drinks will taste bad. If any significant side effects are noted on this drug, please contact our office.  Cervical Sprain A cervical sprain is an injury in the neck in which the strong, fibrous tissues (ligaments) that connect your neck bones stretch or tear. Cervical sprains can range from mild to severe. Severe cervical sprains can cause the neck vertebrae to be unstable. This can lead to damage of the spinal cord and can result in serious nervous system problems. The amount of time it takes for a cervical sprain to get better depends on the cause and extent of the injury. Most cervical sprains heal in 1 to 3 weeks. CAUSES  Severe cervical sprains may be caused by:   Contact sport injuries (such as from football, rugby, wrestling, hockey, auto racing, gymnastics, diving, martial arts, or boxing).   Motor vehicle collisions.   Whiplash injuries. This is an injury from a sudden forward and backward whipping movement of the head and neck.  Falls.  Mild cervical sprains may be caused by:   Being in an awkward position, such as while cradling a telephone between your ear and shoulder.   Sitting in a chair that does not offer proper support.   Working at a poorly designed computer station.   Looking up or down for long periods of time.  SYMPTOMS   Pain, soreness, stiffness, or a burning sensation in the front, back, or sides of the neck. This discomfort may develop immediately after the injury or slowly, 24 hours or more after the injury.   Pain or tenderness directly in the middle of the back of the neck.   Shoulder or upper back pain.   Limited ability to move the neck.   Headache.   Dizziness.   Weakness, numbness, or tingling  in the hands or arms.   Muscle spasms.   Difficulty swallowing or chewing.   Tenderness and swelling of the neck.  DIAGNOSIS  Most of the time your health care provider can diagnose a cervical sprain by taking your history and doing a physical exam. Your health care provider will ask about previous neck injuries and any known neck problems, such as arthritis in the neck. X-rays may be taken to find out if there are any other problems, such as with the bones of the neck. Other tests, such as a CT scan or MRI, may also be needed.  TREATMENT  Treatment depends on the severity of the cervical sprain. Mild sprains can be treated with rest, keeping the neck in place (immobilization), and pain medicines. Severe cervical sprains are immediately immobilized. Further treatment is done to help with pain, muscle spasms, and other symptoms and may include:  Medicines, such as pain relievers, numbing medicines, or muscle relaxants.   Physical therapy. This may involve stretching exercises, strengthening exercises, and posture training. Exercises and improved posture can help stabilize the neck, strengthen muscles, and help stop symptoms from returning.  HOME CARE INSTRUCTIONS   Put ice on the injured area.   Put ice in a plastic bag.   Place a towel between your skin and the bag.   Leave the ice on for 15-20 minutes, 3-4 times a day.   If your injury was severe, you may have been given a cervical collar to wear. A   cervical collar is a two-piece collar designed to keep your neck from moving while it heals.  Do not remove the collar unless instructed by your health care provider.  If you have long hair, keep it outside of the collar.  Ask your health care provider before making any adjustments to your collar. Minor adjustments may be required over time to improve comfort and reduce pressure on your chin or on the back of your head.  Ifyou are allowed to remove the collar for cleaning or  bathing, follow your health care provider's instructions on how to do so safely.  Keep your collar clean by wiping it with mild soap and water and drying it completely. If the collar you have been given includes removable pads, remove them every 1-2 days and hand wash them with soap and water. Allow them to air dry. They should be completely dry before you wear them in the collar.  If you are allowed to remove the collar for cleaning and bathing, wash and dry the skin of your neck. Check your skin for irritation or sores. If you see any, tell your health care provider.  Do not drive while wearing the collar.   Only take over-the-counter or prescription medicines for pain, discomfort, or fever as directed by your health care provider.   Keep all follow-up appointments as directed by your health care provider.   Keep all physical therapy appointments as directed by your health care provider.   Make any needed adjustments to your workstation to promote good posture.   Avoid positions and activities that make your symptoms worse.   Warm up and stretch before being active to help prevent problems.  SEEK MEDICAL CARE IF:   Your pain is not controlled with medicine.   You are unable to decrease your pain medicine over time as planned.   Your activity level is not improving as expected.  SEEK IMMEDIATE MEDICAL CARE IF:   You develop any bleeding.  You develop stomach upset.  You have signs of an allergic reaction to your medicine.   Your symptoms get worse.   You develop new, unexplained symptoms.   You have numbness, tingling, weakness, or paralysis in any part of your body.  MAKE SURE YOU:   Understand these instructions.  Will watch your condition.  Will get help right away if you are not doing well or get worse. Document Released: 12/27/2006 Document Revised: 03/06/2013 Document Reviewed: 09/06/2012 [employer] Patient Information 2015 [employer]. This  information is not intended to replace advice given to you by your health care provider. Make sure you discuss any questions you have with your health care provider.  

## 2013-09-25 NOTE — Progress Notes (Signed)
Reason for visit: Headache  Robin Blackwell is a 43 y.o. female  History of present illness:  Robin Blackwell is a 43 year old left-handed black female with a history of migraine headaches since age 89. The patient indicates that her headaches are menstrual associated and may occur 2 or 3 times a month around the time of the menstrual cycle. The headaches typically last about 2 hours and then resolve. The patient indicates that her headaches usually will start with sharp pains suddenly in the bitemporal regions, and may be associated with a throbbing pain. The patient will generally do not have phonophobia, but occasion she will have photophobia and some occasional nausea usually without vomiting. She will occasionally have some blurred vision with the headache. More recently, the patient began having headaches around the beginning of June 2015. The headaches are associated with left shoulder discomfort, now spreading to the right shoulder associated with some neck discomfort and pain in the back of the head primarily on the right. She has gone to the emergency room, and a CT scan of the head was done and was unremarkable. CT angiography of the head and neck were done and were unremarkable. The patient has been seen by a neurologist in the [place] area, and she has had the addition of Topamax, baclofen along with dry needle therapy of her right shoulder. This has helped some. The patient has taken Excedrin migraine without much benefit. She comes to this office for further evaluation. She does report some neck stiffness.  Past Medical History  Diagnosis Date  . Endometriosis   . Migraine without aura, without mention of intractable migraine without mention of status migrainosus 09/25/2013  . Uterine fibroid     Past Surgical History  Procedure Laterality Date  . Knee arthroscopy  2013    left  . Hernia repair      umbilical  . Abdominal exploration surgery      Family History  Problem  Relation Age of Onset  . Hypertension Mother   . Alcoholism Father   . Migraines Father   . Stroke Maternal Grandmother   . Breast cancer Paternal Grandmother   . Alcoholism Paternal Grandfather     Social history:  reports that she has never smoked. She has never used smokeless tobacco. She reports that she does not drink alcohol or use illicit drugs.  Medications:  No current outpatient prescriptions on file prior to visit.   No current facility-administered medications on file prior to visit.     No Known Allergies  ROS:  Out of a complete 14 system review of symptoms, the patient complains only of the following symptoms, and all other reviewed systems are negative.  Achy muscles Headache, dizziness  Blood pressure 122/85, pulse 73, height 5\' 8"  (1.727 m), weight 168 lb (76.204 kg), last menstrual period 08/20/2013.  Physical Exam  General: The patient is alert and cooperative at the time of the examination.  Eyes: Pupils are equal, round, and reactive to light. Discs are flat bilaterally.  Neck: The neck is supple, no carotid bruits are noted.  Respiratory: The respiratory examination is clear.  Cardiovascular: The cardiovascular examination reveals a regular rate and rhythm, no obvious murmurs or rubs are noted.  Neuromuscular: The patient lacks about 20 of full lateral rotation of the cervical spine bilaterally.  Skin: Extremities are without significant edema.  Neurologic Exam  Mental status: The patient is alert and oriented x 3 at the time of the examination. The patient has  apparent normal recent and remote memory, with an apparently normal attention span and concentration ability.  Cranial nerves: Facial symmetry is present. There is good sensation of the face to pinprick and soft touch bilaterally. The strength of the facial muscles and the muscles to head turning and shoulder shrug are normal bilaterally. Speech is well enunciated, no aphasia or  dysarthria is noted. Extraocular movements are full. Visual fields are full. The tongue is midline, and the patient has symmetric elevation of the soft palate. No obvious hearing deficits are noted.  Motor: The motor testing reveals 5 over 5 strength of all 4 extremities. Good symmetric motor tone is noted throughout.  Sensory: Sensory testing is intact to pinprick, soft touch, vibration sensation, and position sense on all 4 extremities. No evidence of extinction is noted.  Coordination: Cerebellar testing reveals good finger-nose-finger and heel-to-shin bilaterally.  Gait and station: Gait is normal. Tandem gait is normal. Romberg is negative. No drift is seen.  Reflexes: Deep tendon reflexes are symmetric and normal bilaterally. Toes are downgoing bilaterally.   CTA head and neck 09/07/13:  IMPRESSION:  No extracranial stenosis or dissection. No significant mediastinal,  cervicothoracic spinal, or soft tissue neck abnormality.  Unremarkable brain and intracranial circulation. No acute or focal  abnormality is detected. No flow-limiting stenosis or occlusion.  No extracranial cause for headache is observed.    Assessment/Plan:  1. Migraine headache, menstrual migraine  2. Cervical strain syndrome  The patient mainly is having pain currently associated with neuromuscular discomfort in the neck, right greater than left at this point. She does have to migraine headache, however. She has been placed on Topamax, currently at 25 mg at night. She is to go to 50 mg at night and eventually to 75 mg if she is tolerating the medication. She has been placed on baclofen, and I would recommend taking this on a scheduled basis at 5 mg 3 times daily. She will be sent for physical therapy for neuromuscular therapy on the neck and shoulders. She will followup through this office in 6-8 weeks.  Jill Alexanders MD 09/25/2013 9:13 PM  [facility] [address] [place], [place] [address]  Phone [phone] Fax [phone]

## 2013-11-07 ENCOUNTER — Ambulatory Visit: Payer: BC Managed Care – PPO | Admitting: Adult Health

## 2013-11-30 DIAGNOSIS — G44209 Tension-type headache, unspecified, not intractable: Secondary | ICD-10-CM | POA: Insufficient documentation

## 2014-10-10 ENCOUNTER — Encounter (HOSPITAL_BASED_OUTPATIENT_CLINIC_OR_DEPARTMENT_OTHER): Admission: RE | Payer: Self-pay | Source: Ambulatory Visit

## 2014-10-10 ENCOUNTER — Ambulatory Visit (HOSPITAL_BASED_OUTPATIENT_CLINIC_OR_DEPARTMENT_OTHER): Admission: RE | Admit: 2014-10-10 | Payer: Self-pay | Source: Ambulatory Visit | Admitting: Obstetrics and Gynecology

## 2014-10-10 SURGERY — LAPAROSCOPIC GELPORT ASSISTED MYOMECTOMY
Anesthesia: General

## 2014-12-29 ENCOUNTER — Encounter (HOSPITAL_COMMUNITY): Payer: Self-pay | Admitting: *Deleted

## 2014-12-29 ENCOUNTER — Emergency Department (HOSPITAL_COMMUNITY)
Admission: EM | Admit: 2014-12-29 | Discharge: 2014-12-29 | Disposition: A | Discharge status: Home / Self Care | Payer: 59 | Source: Home / Self Care | Attending: Family Medicine | Admitting: Family Medicine

## 2014-12-29 DIAGNOSIS — K529 Noninfective gastroenteritis and colitis, unspecified: Secondary | ICD-10-CM

## 2014-12-29 MED ORDER — METRONIDAZOLE 500 MG PO TABS: 500.0000 mg | ORAL_TABLET | Freq: Two times a day (BID) | ORAL | Status: DC

## 2014-12-29 NOTE — Discharge Instructions (Signed)
Chronic Diarrhea Diarrhea is frequent loose and watery bowel movements. It can cause you to feel weak and dehydrated. Dehydration can cause you to become tired and thirsty and to have a dry mouth, decreased urination, and dark yellow urine. Diarrhea is a sign of another problem, most often an infection that will not last long. In most cases, diarrhea lasts 2-3 days. Diarrhea that lasts longer than 4 weeks is called long-lasting (chronic) diarrhea. It is important to treat your diarrhea as directed by your health care provider to lessen or prevent future episodes of diarrhea.  CAUSES  There are many causes of chronic diarrhea. The following are some possible causes:   Gastrointestinal infections caused by viruses, bacteria, or parasites.   Food poisoning or food allergies.   Certain medicines, such as antibiotics, chemotherapy, and laxatives.   Artificial sweeteners and fructose.   Digestive disorders, such as celiac disease and inflammatory bowel diseases.   Irritable bowel syndrome.  Some disorders of the pancreas.  Disorders of the thyroid.  Reduced blood flow to the intestines.  Cancer. Sometimes the cause of chronic diarrhea is unknown. RISK FACTORS  Having a severely weakened immune system, such as from HIV or AIDS.   Taking certain types of cancer-fighting drugs (such as with chemotherapy) or other medicines.   Having had a recent organ transplant.   Having a portion of the stomach or small bowel removed.   Traveling to countries where food and water supplies are often contaminated.  SYMPTOMS  In addition to frequent, loose stools, diarrhea may cause:   Cramping.   Abdominal pain.   Nausea.   Fever.  Fatigue.  Urgent need to use the bathroom.  Loss of bowel control. DIAGNOSIS  Your health care provider must take a careful history and perform a physical exam. Tests given are based on your symptoms and history. Tests may include:   Blood or  stool tests. Three or more stool samples may be examined. Stool cultures may be used to test for bacteria or parasites.   X-rays.   A procedure in which a thin tube is inserted into the mouth or rectum (endoscopy). This allows the health care provider to look inside the intestine.  TREATMENT   Treatment is aimed at correcting the cause of the diarrhea when possible.  Diarrhea caused by an infection can often be treated with antibiotic medicines.  Diarrhea not caused by an infection may require you to take long-term medicine or have surgery. Specific treatment should be discussed with your health care provider.  If the cause cannot be determined, treatment aims to relieve symptoms and prevent dehydration. Serious health problems can occur if you do not maintain proper fluid levels. Treatment may include:  Taking an oral rehydration solution (ORS).  Not drinking beverages that contain caffeine (such as tea, coffee, and soft drinks).  Not drinking alcohol.  Maintaining well-balanced nutrition to help you recover faster. HOME CARE INSTRUCTIONS   Drink enough fluids to keep urine clear or pale yellow. Drink 1 cup (8 oz) of fluid for each diarrhea episode. Avoid fluids that contain simple sugars, fruit juices, whole milk products, and sodas. Hydrate with an ORS. You may purchase the ORS or prepare it at home by mixing the following ingredients together:   - tsp (1.7-3  mL) table salt.   tsp (3  mL) baking soda.   tsp (1.7 mL) salt substitute containing potassium chloride.  1 tbsp (20 mL) sugar.  4.2 c (1 L) of water.     Certain foods and beverages may increase the speed at which food moves through the gastrointestinal (GI) tract. These foods and beverages should be avoided. They include:  Caffeinated and alcoholic beverages.  High-fiber foods, such as raw fruits and vegetables, nuts, seeds, and whole grain breads and cereals.  Foods and beverages sweetened with sugar  alcohols, such as xylitol, sorbitol, and mannitol.   Some foods may be well tolerated and may help thicken stool. These include:  Starchy foods, such as rice, toast, pasta, low-sugar cereal, oatmeal, grits, baked potatoes, crackers, and bagels.  Bananas.  Applesauce.  Add probiotic-rich foods to help increase healthy bacteria in the GI tract. These include yogurt and fermented milk products.  Wash your hands well after each diarrhea episode.  Only take over-the-counter or prescription medicines as directed by your health care provider.  Take a warm bath to relieve any burning or pain from frequent diarrhea episodes. SEEK MEDICAL CARE IF:   You are not urinating as often.  Your urine is a dark color.  You become very tired or dizzy.  You have severe pain in the abdomen or rectum.  Your have blood or pus in your stools.  Your stools look black and tarry. SEEK IMMEDIATE MEDICAL CARE IF:   You are unable to keep fluids down.  You have persistent vomiting.  You have blood in your stool.  Your stools are black and tarry.  You do not urinate in 6-8 hours, or there is only a small amount of very dark urine.  You have abdominal pain that increases or localizes.  You have weakness, dizziness, confusion, or lightheadedness.  You have a severe headache.  Your diarrhea gets worse or does not get better.  You have a fever or persistent symptoms for more than 2-3 days.  You have a fever and your symptoms suddenly get worse. MAKE SURE YOU:   Understand these instructions.  Will watch your condition.  Will get help right away if you are not doing well or get worse.   This information is not intended to replace advice given to you by your health care provider. Make sure you discuss any questions you have with your health care provider.   Document Released: 05/22/2003 Document Revised: 03/06/2013 Document Reviewed: 08/24/2012 Elsevier Interactive Patient Education 2016  Elsevier Inc.  

## 2014-12-29 NOTE — ED Provider Notes (Signed)
CSN: [id number]     Arrival date & time 12/29/14  1322 History   First MD Initiated Contact with Patient 12/29/14 1416     Chief Complaint  Patient presents with  . Diarrhea  . Nausea   (Consider location/radiation/quality/duration/timing/severity/associated sxs/prior Treatment) HPI  Past Medical History  Diagnosis Date  . Endometriosis   . Migraine without aura, without mention of intractable migraine without mention of status migrainosus 09/25/2013  . Uterine fibroid    Past Surgical History  Procedure Laterality Date  . Knee arthroscopy  2013    left  . Hernia repair      umbilical  . Abdominal exploration surgery     Family History  Problem Relation Age of Onset  . Hypertension Mother   . Alcoholism Father   . Migraines Father   . Stroke Maternal Grandmother   . Breast cancer Paternal Grandmother   . Alcoholism Paternal Grandfather    Social History  Substance Use Topics  . Smoking status: Never Smoker   . Smokeless tobacco: Never Used  . Alcohol Use: No   OB History    No data available     Review of Systems  Constitutional: Positive for activity change, appetite change and fatigue. Negative for fever, chills and diaphoresis.  HENT: Negative.   Eyes: Negative.   Respiratory: Negative.   Cardiovascular: Negative.   Genitourinary: Negative.   Psychiatric/Behavioral: Negative.     Allergies  Review of patient's allergies indicates no known allergies.  Home Medications   Prior to Admission medications   Medication Sig Start Date End Date Taking? Authorizing Provider  DANAZOL PO Take by mouth.   Yes Historical Provider, MD  letrozole (FEMARA) 2.5 MG tablet Take 2.5 mg by mouth daily.   Yes Historical Provider, MD  topiramate (TOPAMAX) 25 MG tablet Take 25 mg by mouth at bedtime. 09/17/13 09/17/14  Historical Provider, MD   Meds Ordered and Administered this Visit  Medications - No data to display  BP 144/92 mmHg  Pulse 89  Temp(Src) 98 F (36.7 C)  (Oral)  Resp 20  SpO2 98%  LMP  No data found.  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 She was treated with amoxicillin 10 days ago for sinus infection and is only marginally gotten better. Nevertheless, shortly after starting the amoxicillin, she developed nausea and diarrhea. She had some vomiting yesterday. She stayed on clear liquids and has been taking Imodium with minimal benefit.  The diarrhea is associated with nausea and cramps. There's been no blood in the stool. She's had no cough.  Her sinus congestion continues although it is better.  Physical Exam  Constitutional: She is oriented to person, place, and time. She appears well-developed and well-nourished.  HENT:  Head: Normocephalic and atraumatic.  Right Ear: External ear normal.  Left Ear: External ear normal.  Mouth/Throat: Oropharynx is clear and moist.  Eyes: Conjunctivae are normal. Pupils are equal, round, and reactive to light.  Neck: Normal range of motion. Neck supple. No thyromegaly present.  Cardiovascular: Normal rate and normal heart sounds.   Pulmonary/Chest: Effort normal.  Abdominal: Soft. There is tenderness.  Patient has diffuse mild tenderness with no HSM or masses  Musculoskeletal: Normal range of motion.  Neurological: She is alert and oriented to person, place, and time.  Skin: Skin is warm and dry.  Psychiatric: She has a normal mood and affect. Her behavior is normal. Thought content normal.  Nursing note and vitals reviewed.   ED Course  Procedures (including critical care time)          MDM   This chart was scribed in my presence and reviewed by me personally.    ICD-9-CM ICD-10-CM   1. Gastroenteritis 558.9 K52.9 metroNIDAZOLE (FLAGYL) 500 MG tablet     Signed, Robyn Haber, MD     Robyn Haber, MD 12/29/14 1430

## 2014-12-29 NOTE — ED Notes (Signed)
C/O nausea and diarrhea since starting amoxicillin for sinus infection 2 wks ago.  Diarrhea has continued and has gotten worse.  Pt reports 7 episodes diarrhea since 0830 this AM.  Denies blood in stool.  C/O nausea; had one day of vomiting, but not since Thurs.  Has taken Mylanta and 2 Imodium (this AM).

## 2014-12-29 NOTE — ED Notes (Signed)
Pt unable to provide stool sample.

## 2015-02-10 ENCOUNTER — Emergency Department (HOSPITAL_COMMUNITY)
Admission: EM | Admit: 2015-02-10 | Discharge: 2015-02-11 | Discharge status: Against Medical Advice | Payer: 59 | Attending: Emergency Medicine | Admitting: Emergency Medicine

## 2015-02-10 ENCOUNTER — Encounter (HOSPITAL_COMMUNITY): Payer: Self-pay | Admitting: Emergency Medicine

## 2015-02-10 DIAGNOSIS — R1110 Vomiting, unspecified: Secondary | ICD-10-CM | POA: Insufficient documentation

## 2015-02-10 DIAGNOSIS — R197 Diarrhea, unspecified: Secondary | ICD-10-CM | POA: Insufficient documentation

## 2015-02-10 LAB — COMPREHENSIVE METABOLIC PANEL
ALT: 18 U/L (ref 14–54)
AST: 23 U/L (ref 15–41)
Albumin: 3.9 g/dL (ref 3.5–5.0)
Alkaline Phosphatase: 32 U/L — ABNORMAL LOW (ref 38–126)
Anion gap: 12 (ref 5–15)
BUN: 11 mg/dL (ref 6–20)
CO2: 25 mmol/L (ref 22–32)
Calcium: 8.9 mg/dL (ref 8.9–10.3)
Chloride: 100 mmol/L — ABNORMAL LOW (ref 101–111)
Creatinine, Ser: 0.87 mg/dL (ref 0.44–1.00)
GFR calc Af Amer: >60 mL/min (ref >60)
GFR calc non Af Amer: >60 mL/min (ref >60)
Glucose, Bld: 99 mg/dL (ref 65–99)
Potassium: 2.8 mmol/L — ABNORMAL LOW (ref 3.5–5.1)
Sodium: 137 mmol/L (ref 135–145)
Total Bilirubin: 0.6 mg/dL (ref 0.3–1.2)
Total Protein: 7.9 g/dL (ref 6.5–8.1)

## 2015-02-10 LAB — URINALYSIS, ROUTINE W REFLEX MICROSCOPIC
Glucose, UA: NEGATIVE mg/dL
Ketones, ur: >80 mg/dL — AB
Nitrite: NEGATIVE
Protein, ur: 30 mg/dL — AB
Specific Gravity, Urine: >1.046 — ABNORMAL HIGH (ref 1.005–1.030)
pH: 6 (ref 5.0–8.0)

## 2015-02-10 LAB — URINE MICROSCOPIC-ADD ON

## 2015-02-10 LAB — CBC
HCT: 39.8 % (ref 36.0–46.0)
Hemoglobin: 13.4 g/dL (ref 12.0–15.0)
MCH: 30 pg (ref 26.0–34.0)
MCHC: 33.7 g/dL (ref 30.0–36.0)
MCV: 89.2 fL (ref 78.0–100.0)
Platelets: 284 10*3/uL (ref 150–400)
RBC: 4.46 MIL/uL (ref 3.87–5.11)
RDW: 16.2 % — ABNORMAL HIGH (ref 11.5–15.5)
WBC: 6.1 10*3/uL (ref 4.0–10.5)

## 2015-02-10 LAB — LIPASE, BLOOD: Lipase: 23 U/L (ref 11–51)

## 2015-02-10 NOTE — ED Notes (Signed)
Patient sent by PCP for evaluation for CT abdomen/pelvis showing colitis and to receive "hydration". Patient states PCP wants to speak with GI about scheduling a colonoscopy. Patient c/o N/V/D, reports 1 episode of emesis and unknown amount of diarrhea described as "partly soft/partly water". Recent course of antibiotics.

## 2015-02-11 NOTE — ED Notes (Signed)
Patient called   No answer

## 2015-02-11 NOTE — ED Notes (Signed)
No answer when called from waiting room

## 2015-02-11 NOTE — ED Notes (Signed)
No answer from waiting room.

## 2015-02-12 LAB — URINE CULTURE: Special Requests: NORMAL

## 2015-02-13 HISTORY — PX: MYOMECTOMY: SHX85

## 2015-03-05 ENCOUNTER — Ambulatory Visit: Admit: 2015-03-05 | Payer: Self-pay | Admitting: Obstetrics and Gynecology

## 2015-03-05 SURGERY — ROBOTIC ASSISTED MYOMECTOMY
Anesthesia: General

## 2015-07-22 DIAGNOSIS — I10 Essential (primary) hypertension: Secondary | ICD-10-CM | POA: Diagnosis not present

## 2015-07-22 DIAGNOSIS — B373 Candidiasis of vulva and vagina: Secondary | ICD-10-CM | POA: Diagnosis not present

## 2015-07-22 DIAGNOSIS — K5909 Other constipation: Secondary | ICD-10-CM | POA: Diagnosis not present

## 2015-07-22 DIAGNOSIS — Z9889 Other specified postprocedural states: Secondary | ICD-10-CM | POA: Diagnosis not present

## 2015-08-26 DIAGNOSIS — L700 Acne vulgaris: Secondary | ICD-10-CM | POA: Diagnosis not present

## 2015-08-29 DIAGNOSIS — Z6828 Body mass index (BMI) 28.0-28.9, adult: Secondary | ICD-10-CM | POA: Diagnosis not present

## 2015-08-29 DIAGNOSIS — Z8249 Family history of ischemic heart disease and other diseases of the circulatory system: Secondary | ICD-10-CM | POA: Diagnosis not present

## 2015-08-29 DIAGNOSIS — Z0000 Encounter for general adult medical examination without abnormal findings: Secondary | ICD-10-CM | POA: Diagnosis not present

## 2015-08-29 DIAGNOSIS — N920 Excessive and frequent menstruation with regular cycle: Secondary | ICD-10-CM | POA: Diagnosis not present

## 2015-09-10 DIAGNOSIS — B373 Candidiasis of vulva and vagina: Secondary | ICD-10-CM | POA: Diagnosis not present

## 2015-09-10 DIAGNOSIS — N760 Acute vaginitis: Secondary | ICD-10-CM | POA: Diagnosis not present

## 2015-09-10 DIAGNOSIS — Z114 Encounter for screening for human immunodeficiency virus [HIV]: Secondary | ICD-10-CM | POA: Diagnosis not present

## 2015-09-10 DIAGNOSIS — Z1159 Encounter for screening for other viral diseases: Secondary | ICD-10-CM | POA: Diagnosis not present

## 2015-09-10 DIAGNOSIS — Z113 Encounter for screening for infections with a predominantly sexual mode of transmission: Secondary | ICD-10-CM | POA: Diagnosis not present

## 2015-10-10 DIAGNOSIS — Z01419 Encounter for gynecological examination (general) (routine) without abnormal findings: Secondary | ICD-10-CM | POA: Diagnosis not present

## 2015-10-10 DIAGNOSIS — Z1151 Encounter for screening for human papillomavirus (HPV): Secondary | ICD-10-CM | POA: Diagnosis not present

## 2015-10-10 DIAGNOSIS — Z6829 Body mass index (BMI) 29.0-29.9, adult: Secondary | ICD-10-CM | POA: Diagnosis not present

## 2015-10-10 DIAGNOSIS — Z1231 Encounter for screening mammogram for malignant neoplasm of breast: Secondary | ICD-10-CM | POA: Diagnosis not present

## 2015-11-20 ENCOUNTER — Ambulatory Visit: Payer: Self-pay | Admitting: Dietician

## 2015-12-11 ENCOUNTER — Encounter: Payer: BLUE CROSS/BLUE SHIELD | Attending: Obstetrics and Gynecology | Admitting: Dietician

## 2015-12-11 ENCOUNTER — Encounter: Payer: Self-pay | Admitting: Dietician

## 2015-12-11 DIAGNOSIS — Z713 Dietary counseling and surveillance: Secondary | ICD-10-CM | POA: Diagnosis not present

## 2015-12-11 DIAGNOSIS — E663 Overweight: Secondary | ICD-10-CM

## 2015-12-11 NOTE — Patient Instructions (Addendum)
-  Start keeping easy snacks on hand to eat throughout the day if you can't stop for a meal  -[place] yogurt, veggies, cheese, crackers, boiled egg, granola bars, peanut butter, nuts, lite popcorn, fruit  -Try to eat something every few hours throughout the day  -Pay attention to serving sizes on the food label and pre-portion

## 2015-12-11 NOTE — Progress Notes (Signed)
  Medical Nutrition Therapy:  Appt start time: 210 end time:  320   Assessment:  Primary concerns today: Robin Blackwell is here today to discuss her weight. 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 She states that she weighed about 140 lbs prior to the birth of her son at age 45. Was on prednisone for arthritis and had fibroid tumors removed about a year ago. She also has a history of chronic constipation. Takes probiotic supplement (sometimes stool softeners or laxatives) and notes that this helps her have bowel movements. Vannary states she has a very active lifestyle and tries to eat healthy. Feels like she generally has healthy habits. She lives with her boyfriend and 11-year old son.   Would like to weigh around 140-160 lbs. Size 9-10.   Preferred Learning Style:   No preference indicated   Learning Readiness:  Ready  MEDICATIONS: see list   DIETARY INTAKE:  Erratic meal pattern. Avoided foods include a lot of meat, no red meat, creamy salad dressing.    24-hr recall:  B (7:30 AM): coffee with cream and sugar, PBJ sandwich on wheat sandwich thins  Snk ( AM): usually none, sometimes Fiber One bar or cheese [place] from [employer]  L ( PM): usually none, sometimes Chipotle Snk ( PM):  D (5-5:30 PM): salad OR chicken  Snk ( PM):   Beverages: water, coffee with cream and sugar, hot tea with honey  Usual physical activity: a lot of walking throughout the day, zumba at least 1x a week, stretching  Estimated energy needs: 1800-2000 calories  Progress Towards Goal(s):  In progress.   Nutritional Diagnosis:  [place]-3.3 Overweight/obesity As related to erratic eating pattern.  As evidenced by BMI 28.5.    Intervention:  Nutrition counseling provided. -Start keeping easy snacks on hand to eat throughout the day if you can't stop for a meal  -[place] yogurt, veggies, cheese, crackers, boiled egg, granola bars, peanut butter, nuts, lite popcorn, fruit -Try to eat  something every few hours throughout the day -Pay attention to serving sizes on the food label and pre-portion  Teaching Method Utilized:  Visual Auditory Hands on  Handouts given during visit include:  Snack list  Barriers to learning/adherence to lifestyle change: work/school schedule  Demonstrated degree of understanding via:  Teach Back   Monitoring/Evaluation:  Dietary intake, exercise, and body weight prn.

## 2016-01-27 DIAGNOSIS — Z8679 Personal history of other diseases of the circulatory system: Secondary | ICD-10-CM | POA: Insufficient documentation

## 2016-01-27 DIAGNOSIS — Z8759 Personal history of other complications of pregnancy, childbirth and the puerperium: Secondary | ICD-10-CM | POA: Diagnosis not present

## 2016-01-27 DIAGNOSIS — R5383 Other fatigue: Secondary | ICD-10-CM | POA: Diagnosis not present

## 2016-01-27 DIAGNOSIS — K5900 Constipation, unspecified: Secondary | ICD-10-CM | POA: Diagnosis not present

## 2016-01-27 DIAGNOSIS — R030 Elevated blood-pressure reading, without diagnosis of hypertension: Secondary | ICD-10-CM | POA: Diagnosis not present

## 2016-01-27 DIAGNOSIS — I10 Essential (primary) hypertension: Secondary | ICD-10-CM | POA: Diagnosis not present

## 2016-01-28 DIAGNOSIS — R5383 Other fatigue: Secondary | ICD-10-CM | POA: Diagnosis not present

## 2016-01-28 DIAGNOSIS — R030 Elevated blood-pressure reading, without diagnosis of hypertension: Secondary | ICD-10-CM | POA: Diagnosis not present

## 2016-03-18 DIAGNOSIS — M62838 Other muscle spasm: Secondary | ICD-10-CM | POA: Diagnosis not present

## 2016-03-18 DIAGNOSIS — N939 Abnormal uterine and vaginal bleeding, unspecified: Secondary | ICD-10-CM | POA: Diagnosis not present

## 2016-04-07 DIAGNOSIS — N938 Other specified abnormal uterine and vaginal bleeding: Secondary | ICD-10-CM | POA: Diagnosis not present

## 2016-04-07 DIAGNOSIS — R938 Abnormal findings on diagnostic imaging of other specified body structures: Secondary | ICD-10-CM | POA: Diagnosis not present

## 2016-04-07 DIAGNOSIS — D250 Submucous leiomyoma of uterus: Secondary | ICD-10-CM | POA: Diagnosis not present

## 2016-04-07 DIAGNOSIS — D251 Intramural leiomyoma of uterus: Secondary | ICD-10-CM | POA: Diagnosis not present

## 2016-05-14 DIAGNOSIS — L0201 Cutaneous abscess of face: Secondary | ICD-10-CM | POA: Diagnosis not present

## 2016-05-27 DIAGNOSIS — B373 Candidiasis of vulva and vagina: Secondary | ICD-10-CM | POA: Diagnosis not present

## 2016-05-27 DIAGNOSIS — B9689 Other specified bacterial agents as the cause of diseases classified elsewhere: Secondary | ICD-10-CM | POA: Diagnosis not present

## 2016-05-27 DIAGNOSIS — N760 Acute vaginitis: Secondary | ICD-10-CM | POA: Diagnosis not present

## 2016-07-15 DIAGNOSIS — N938 Other specified abnormal uterine and vaginal bleeding: Secondary | ICD-10-CM | POA: Diagnosis not present

## 2016-07-15 DIAGNOSIS — R938 Abnormal findings on diagnostic imaging of other specified body structures: Secondary | ICD-10-CM | POA: Diagnosis not present

## 2016-07-15 DIAGNOSIS — D259 Leiomyoma of uterus, unspecified: Secondary | ICD-10-CM | POA: Diagnosis not present

## 2016-07-15 DIAGNOSIS — N920 Excessive and frequent menstruation with regular cycle: Secondary | ICD-10-CM | POA: Diagnosis not present

## 2016-07-28 DIAGNOSIS — N920 Excessive and frequent menstruation with regular cycle: Secondary | ICD-10-CM | POA: Diagnosis not present

## 2016-09-03 ENCOUNTER — Other Ambulatory Visit: Payer: Self-pay | Admitting: Obstetrics and Gynecology

## 2016-09-08 NOTE — Patient Instructions (Signed)
Your procedure is scheduled on:  Monday, September 13, 2016  Enter through the [employer] [facility] at:  11:30 AM  Pick up the phone at the desk and dial [phone].  Call this number if you have problems the morning of surgery: [phone].  Remember: Do NOT eat food:  After Midnight Sunday  Do NOT drink clear liquids after:  7:00 AM day of surgery  Take these medicines the morning of surgery with a SIP OF WATER:  None  Stop ALL herbal medications at this time  Do NOT smoke the day of surgery.  Do NOT wear jewelry (body piercing), metal hair clips/bobby pins, make-up, artifical eyelashes or nail polish. Do NOT wear lotions, powders, or perfumes.  You may wear deodorant. Do NOT shave for 48 hours prior to surgery. Do NOT bring valuables to the hospital. Contacts, dentures, or bridgework may not be worn into surgery.  Leave suitcase in car.  After surgery it may be brought to your room.  For patients admitted to the hospital, checkout time is 11:00 AM the day of discharge.  Bring a copy of your healthcare power of attorney and living will documents.

## 2016-09-10 ENCOUNTER — Encounter (HOSPITAL_COMMUNITY): Payer: Self-pay

## 2016-09-10 ENCOUNTER — Encounter (HOSPITAL_COMMUNITY)
Admission: RE | Admit: 2016-09-10 | Discharge: 2016-09-10 | Disposition: A | Discharge status: Home / Self Care | Payer: BLUE CROSS/BLUE SHIELD | Source: Ambulatory Visit | Attending: Obstetrics and Gynecology | Admitting: Obstetrics and Gynecology

## 2016-09-10 DIAGNOSIS — N920 Excessive and frequent menstruation with regular cycle: Secondary | ICD-10-CM | POA: Diagnosis not present

## 2016-09-10 DIAGNOSIS — D259 Leiomyoma of uterus, unspecified: Secondary | ICD-10-CM | POA: Diagnosis not present

## 2016-09-10 DIAGNOSIS — N946 Dysmenorrhea, unspecified: Secondary | ICD-10-CM | POA: Diagnosis not present

## 2016-09-10 DIAGNOSIS — N938 Other specified abnormal uterine and vaginal bleeding: Secondary | ICD-10-CM | POA: Diagnosis not present

## 2016-09-10 HISTORY — DX: Anemia, unspecified: D64.9

## 2016-09-10 LAB — BASIC METABOLIC PANEL
Anion gap: 6 (ref 5–15)
BUN: 18 mg/dL (ref 6–20)
CO2: 26 mmol/L (ref 22–32)
Calcium: 9.6 mg/dL (ref 8.9–10.3)
Chloride: 107 mmol/L (ref 101–111)
Creatinine, Ser: 0.84 mg/dL (ref 0.44–1.00)
GFR calc Af Amer: >60 mL/min (ref >60)
GFR calc non Af Amer: >60 mL/min (ref >60)
Glucose, Bld: 81 mg/dL (ref 65–99)
Potassium: 3.4 mmol/L — ABNORMAL LOW (ref 3.5–5.1)
Sodium: 139 mmol/L (ref 135–145)

## 2016-09-10 LAB — CBC
HCT: 36.7 % (ref 36.0–46.0)
Hemoglobin: 12 g/dL (ref 12.0–15.0)
MCH: 30.1 pg (ref 26.0–34.0)
MCHC: 32.7 g/dL (ref 30.0–36.0)
MCV: 92 fL (ref 78.0–100.0)
Platelets: 253 10*3/uL (ref 150–400)
RBC: 3.99 MIL/uL (ref 3.87–5.11)
RDW: 13.9 % (ref 11.5–15.5)
WBC: 6.2 10*3/uL (ref 4.0–10.5)

## 2016-09-13 ENCOUNTER — Encounter (HOSPITAL_COMMUNITY)
Admission: RE | Disposition: A | Discharge status: Home / Self Care | Payer: Self-pay | Source: Ambulatory Visit | Attending: Obstetrics and Gynecology

## 2016-09-13 ENCOUNTER — Ambulatory Visit (HOSPITAL_COMMUNITY): Payer: BLUE CROSS/BLUE SHIELD | Admitting: Anesthesiology

## 2016-09-13 ENCOUNTER — Ambulatory Visit (HOSPITAL_COMMUNITY)
Admission: RE | Admit: 2016-09-13 | Discharge: 2016-09-14 | Disposition: A | Discharge status: Home / Self Care | Payer: BLUE CROSS/BLUE SHIELD | Source: Ambulatory Visit | Attending: Obstetrics and Gynecology | Admitting: Obstetrics and Gynecology

## 2016-09-13 ENCOUNTER — Encounter (HOSPITAL_COMMUNITY): Payer: Self-pay

## 2016-09-13 DIAGNOSIS — N946 Dysmenorrhea, unspecified: Secondary | ICD-10-CM | POA: Insufficient documentation

## 2016-09-13 DIAGNOSIS — D259 Leiomyoma of uterus, unspecified: Secondary | ICD-10-CM | POA: Diagnosis not present

## 2016-09-13 DIAGNOSIS — Z90710 Acquired absence of both cervix and uterus: Secondary | ICD-10-CM | POA: Diagnosis present

## 2016-09-13 DIAGNOSIS — N938 Other specified abnormal uterine and vaginal bleeding: Secondary | ICD-10-CM | POA: Insufficient documentation

## 2016-09-13 DIAGNOSIS — N920 Excessive and frequent menstruation with regular cycle: Secondary | ICD-10-CM | POA: Diagnosis not present

## 2016-09-13 DIAGNOSIS — G43909 Migraine, unspecified, not intractable, without status migrainosus: Secondary | ICD-10-CM | POA: Diagnosis not present

## 2016-09-13 DIAGNOSIS — D649 Anemia, unspecified: Secondary | ICD-10-CM | POA: Diagnosis not present

## 2016-09-13 DIAGNOSIS — S139XXA Sprain of joints and ligaments of unspecified parts of neck, initial encounter: Secondary | ICD-10-CM | POA: Diagnosis not present

## 2016-09-13 HISTORY — PX: ROBOTIC ASSISTED TOTAL HYSTERECTOMY WITH SALPINGECTOMY: SHX6679

## 2016-09-13 LAB — PREGNANCY, URINE: Preg Test, Ur: NEGATIVE

## 2016-09-13 SURGERY — ROBOTIC ASSISTED TOTAL HYSTERECTOMY WITH SALPINGECTOMY
Anesthesia: General | Site: Abdomen | Laterality: Bilateral

## 2016-09-13 MED ORDER — PROPOFOL 10 MG/ML IV BOLUS
INTRAVENOUS | Status: AC
  Filled 2016-09-13: qty 20

## 2016-09-13 MED ORDER — KETOROLAC TROMETHAMINE 30 MG/ML IJ SOLN
INTRAMUSCULAR | Status: AC
  Filled 2016-09-13: qty 1

## 2016-09-13 MED ORDER — SODIUM CHLORIDE 0.9% FLUSH: 9.0000 mL | INTRAVENOUS | Status: DC | PRN

## 2016-09-13 MED ORDER — MEPERIDINE HCL 25 MG/ML IJ SOLN
INTRAMUSCULAR | Status: AC
Start: 2016-09-13 — End: 2016-09-14
  Filled 2016-09-13: qty 1

## 2016-09-13 MED ORDER — MENTHOL 3 MG MT LOZG: 1.0000 | LOZENGE | OROMUCOSAL | Status: DC | PRN

## 2016-09-13 MED ORDER — FENTANYL CITRATE (PF) 100 MCG/2ML IJ SOLN
INTRAMUSCULAR | Status: AC
  Filled 2016-09-13: qty 2

## 2016-09-13 MED ORDER — SUGAMMADEX SODIUM 200 MG/2ML IV SOLN
INTRAVENOUS | Status: DC | PRN
  Administered 2016-09-13: 200 mg via INTRAVENOUS

## 2016-09-13 MED ORDER — KETOROLAC TROMETHAMINE 30 MG/ML IJ SOLN
INTRAMUSCULAR | Status: DC | PRN
  Administered 2016-09-13: 30 mg via INTRAVENOUS

## 2016-09-13 MED ORDER — HYDROMORPHONE HCL 1 MG/ML IJ SOLN
INTRAMUSCULAR | Status: AC
  Filled 2016-09-13: qty 1

## 2016-09-13 MED ORDER — NALOXONE HCL 0.4 MG/ML IJ SOLN: 0.4000 mg | INTRAMUSCULAR | Status: DC | PRN

## 2016-09-13 MED ORDER — KETOROLAC TROMETHAMINE 30 MG/ML IJ SOLN: 30.0000 mg | Freq: Four times a day (QID) | INTRAMUSCULAR | Status: DC

## 2016-09-13 MED ORDER — BUPIVACAINE HCL (PF) 0.25 % IJ SOLN
INTRAMUSCULAR | Status: DC | PRN
  Administered 2016-09-13: 14 mL

## 2016-09-13 MED ORDER — BUPIVACAINE HCL (PF) 0.25 % IJ SOLN
INTRAMUSCULAR | Status: AC
  Filled 2016-09-13: qty 30

## 2016-09-13 MED ORDER — CEFAZOLIN SODIUM-DEXTROSE 2-4 GM/100ML-% IV SOLN
2.0000 g | INTRAVENOUS | Status: AC
  Administered 2016-09-13: 2 g via INTRAVENOUS

## 2016-09-13 MED ORDER — PANTOPRAZOLE SODIUM 40 MG PO TBEC
40.0000 mg | DELAYED_RELEASE_TABLET | Freq: Every day | ORAL | Status: DC
  Administered 2016-09-14: 40 mg via ORAL
  Filled 2016-09-13: qty 1

## 2016-09-13 MED ORDER — SIMETHICONE 80 MG PO CHEW: 80.0000 mg | CHEWABLE_TABLET | Freq: Four times a day (QID) | ORAL | Status: DC | PRN

## 2016-09-13 MED ORDER — DEXAMETHASONE SODIUM PHOSPHATE 4 MG/ML IJ SOLN
INTRAMUSCULAR | Status: AC
  Filled 2016-09-13: qty 1

## 2016-09-13 MED ORDER — SUGAMMADEX SODIUM 200 MG/2ML IV SOLN
INTRAVENOUS | Status: AC
  Filled 2016-09-13: qty 2

## 2016-09-13 MED ORDER — ONDANSETRON HCL 4 MG/2ML IJ SOLN: 4.0000 mg | Freq: Four times a day (QID) | INTRAMUSCULAR | Status: DC | PRN

## 2016-09-13 MED ORDER — PROPOFOL 10 MG/ML IV BOLUS
INTRAVENOUS | Status: DC | PRN
  Administered 2016-09-13: 200 mg via INTRAVENOUS

## 2016-09-13 MED ORDER — FENTANYL CITRATE (PF) 100 MCG/2ML IJ SOLN
INTRAMUSCULAR | Status: DC | PRN
  Administered 2016-09-13 (×2): 100 ug via INTRAVENOUS
  Administered 2016-09-13: 150 ug via INTRAVENOUS

## 2016-09-13 MED ORDER — LACTATED RINGERS IV SOLN
INTRAVENOUS | Status: DC
  Administered 2016-09-13 (×2): via INTRAVENOUS

## 2016-09-13 MED ORDER — HYDROMORPHONE 1 MG/ML IV SOLN
INTRAVENOUS | Status: DC
  Filled 2016-09-13: qty 25

## 2016-09-13 MED ORDER — OXYCODONE-ACETAMINOPHEN 5-325 MG PO TABS
1.0000 | ORAL_TABLET | ORAL | Status: DC | PRN
  Administered 2016-09-14 (×3): 2 via ORAL
  Filled 2016-09-13 (×3): qty 2

## 2016-09-13 MED ORDER — LIDOCAINE HCL (CARDIAC) 20 MG/ML IV SOLN
INTRAVENOUS | Status: DC | PRN
  Administered 2016-09-13: 50 mg via INTRAVENOUS

## 2016-09-13 MED ORDER — DIPHENHYDRAMINE HCL 12.5 MG/5ML PO ELIX: 12.5000 mg | ORAL_SOLUTION | Freq: Four times a day (QID) | ORAL | Status: DC | PRN

## 2016-09-13 MED ORDER — DEXAMETHASONE SODIUM PHOSPHATE 10 MG/ML IJ SOLN
INTRAMUSCULAR | Status: DC | PRN
  Administered 2016-09-13: 4 mg via INTRAVENOUS

## 2016-09-13 MED ORDER — FENTANYL CITRATE (PF) 100 MCG/2ML IJ SOLN
25.0000 ug | INTRAMUSCULAR | Status: DC | PRN
  Administered 2016-09-13 (×3): 50 ug via INTRAVENOUS

## 2016-09-13 MED ORDER — MIDAZOLAM HCL 2 MG/2ML IJ SOLN
INTRAMUSCULAR | Status: DC | PRN
  Administered 2016-09-13: 2 mg via INTRAVENOUS

## 2016-09-13 MED ORDER — FENTANYL CITRATE (PF) 100 MCG/2ML IJ SOLN
INTRAMUSCULAR | Status: AC
  Administered 2016-09-13: 50 ug via INTRAVENOUS
  Filled 2016-09-13: qty 2

## 2016-09-13 MED ORDER — ONDANSETRON HCL 4 MG/2ML IJ SOLN
INTRAMUSCULAR | Status: AC
  Filled 2016-09-13: qty 2

## 2016-09-13 MED ORDER — DIPHENHYDRAMINE HCL 50 MG/ML IJ SOLN: 12.5000 mg | Freq: Four times a day (QID) | INTRAMUSCULAR | Status: DC | PRN

## 2016-09-13 MED ORDER — FENTANYL CITRATE (PF) 250 MCG/5ML IJ SOLN
INTRAMUSCULAR | Status: AC
  Filled 2016-09-13: qty 5

## 2016-09-13 MED ORDER — MEPERIDINE HCL 25 MG/ML IJ SOLN
6.2500 mg | INTRAMUSCULAR | Status: DC | PRN
  Administered 2016-09-13: 12.5 mg via INTRAVENOUS

## 2016-09-13 MED ORDER — ROCURONIUM BROMIDE 100 MG/10ML IV SOLN
INTRAVENOUS | Status: DC | PRN
  Administered 2016-09-13: 20 mg via INTRAVENOUS
  Administered 2016-09-13: 50 mg via INTRAVENOUS
  Administered 2016-09-13: 10 mg via INTRAVENOUS

## 2016-09-13 MED ORDER — ONDANSETRON HCL 4 MG PO TABS: 4.0000 mg | ORAL_TABLET | Freq: Four times a day (QID) | ORAL | Status: DC | PRN

## 2016-09-13 MED ORDER — SODIUM CHLORIDE 0.9 % IJ SOLN
INTRAMUSCULAR | Status: AC
  Filled 2016-09-13: qty 50

## 2016-09-13 MED ORDER — SODIUM CHLORIDE 0.9 % IV SOLN
INTRAVENOUS | Status: DC | PRN
  Administered 2016-09-13: 60 mL

## 2016-09-13 MED ORDER — DEXTROSE IN LACTATED RINGERS 5 % IV SOLN
INTRAVENOUS | Status: DC
  Administered 2016-09-14: 02:00:00 via INTRAVENOUS

## 2016-09-13 MED ORDER — LACTATED RINGERS IR SOLN
Status: DC | PRN
  Administered 2016-09-13: 3000 mL

## 2016-09-13 MED ORDER — HYDROMORPHONE HCL 1 MG/ML IJ SOLN
INTRAMUSCULAR | Status: DC | PRN
  Administered 2016-09-13: 1 mg via INTRAVENOUS

## 2016-09-13 MED ORDER — HYDROMORPHONE HCL 1 MG/ML IJ SOLN
0.2000 mg | INTRAMUSCULAR | Status: DC | PRN
  Administered 2016-09-13 – 2016-09-14 (×3): 0.6 mg via INTRAVENOUS
  Filled 2016-09-13 (×3): qty 1

## 2016-09-13 MED ORDER — ONDANSETRON HCL 4 MG/2ML IJ SOLN: 4.0000 mg | Freq: Once | INTRAMUSCULAR | Status: DC | PRN

## 2016-09-13 MED ORDER — ONDANSETRON HCL 4 MG/2ML IJ SOLN
INTRAMUSCULAR | Status: DC | PRN
  Administered 2016-09-13: 4 mg via INTRAVENOUS

## 2016-09-13 MED ORDER — SCOPOLAMINE 1 MG/3DAYS TD PT72
1.0000 | MEDICATED_PATCH | Freq: Once | TRANSDERMAL | Status: DC
  Administered 2016-09-13: 1.5 mg via TRANSDERMAL

## 2016-09-13 MED ORDER — SCOPOLAMINE 1 MG/3DAYS TD PT72
MEDICATED_PATCH | TRANSDERMAL | Status: AC
  Administered 2016-09-13: 1.5 mg via TRANSDERMAL
  Filled 2016-09-13: qty 1

## 2016-09-13 MED ORDER — LIDOCAINE HCL (CARDIAC) 20 MG/ML IV SOLN
INTRAVENOUS | Status: AC
  Filled 2016-09-13: qty 5

## 2016-09-13 MED ORDER — MIDAZOLAM HCL 2 MG/2ML IJ SOLN
INTRAMUSCULAR | Status: AC
  Filled 2016-09-13: qty 2

## 2016-09-13 MED ORDER — ROPIVACAINE HCL 5 MG/ML IJ SOLN
INTRAMUSCULAR | Status: AC
  Filled 2016-09-13: qty 30

## 2016-09-13 MED ORDER — KETOROLAC TROMETHAMINE 30 MG/ML IJ SOLN
30.0000 mg | Freq: Four times a day (QID) | INTRAMUSCULAR | Status: DC
  Administered 2016-09-13 – 2016-09-14 (×2): 30 mg via INTRAVENOUS
  Filled 2016-09-13 (×2): qty 1

## 2016-09-13 MED ORDER — ROCURONIUM BROMIDE 100 MG/10ML IV SOLN
INTRAVENOUS | Status: AC
Start: 2016-09-13 — End: ?
  Filled 2016-09-13: qty 1

## 2016-09-13 MED ORDER — IBUPROFEN 800 MG PO TABS: 800.0000 mg | ORAL_TABLET | Freq: Three times a day (TID) | ORAL | Status: DC | PRN

## 2016-09-13 SURGICAL SUPPLY — 57 items
ADH SKN CLS APL DERMABOND .7 (GAUZE/BANDAGES/DRESSINGS) ×1
APL SRG 38 LTWT LNG FL B (MISCELLANEOUS)
APPLICATOR ARISTA FLEXITIP XL (MISCELLANEOUS) IMPLANT
BARRIER ADHS 3X4 INTERCEED (GAUZE/BANDAGES/DRESSINGS) IMPLANT
BRR ADH 4X3 ABS CNTRL BYND (GAUZE/BANDAGES/DRESSINGS)
CATH FOLEY 3WAY  5CC 16FR (CATHETERS) ×1
CATH FOLEY 3WAY 5CC 16FR (CATHETERS) ×1 IMPLANT
CLOTH BEACON ORANGE TIMEOUT ST (SAFETY) ×2 IMPLANT
CONT PATH 16OZ SNAP LID 3702 (MISCELLANEOUS) ×2 IMPLANT
COVER BACK TABLE 60X90IN (DRAPES) ×4 IMPLANT
COVER TIP SHEARS 8 DVNC (MISCELLANEOUS) ×1 IMPLANT
COVER TIP SHEARS 8MM DA VINCI (MISCELLANEOUS) ×1
DECANTER SPIKE VIAL GLASS SM (MISCELLANEOUS) ×5 IMPLANT
DEFOGGER SCOPE WARMER CLEARIFY (MISCELLANEOUS) ×2 IMPLANT
DERMABOND ADVANCED (GAUZE/BANDAGES/DRESSINGS) ×1
DERMABOND ADVANCED .7 DNX12 (GAUZE/BANDAGES/DRESSINGS) ×1 IMPLANT
DURAPREP 26ML APPLICATOR (WOUND CARE) ×2 IMPLANT
ELECT REM PT RETURN 9FT ADLT (ELECTROSURGICAL) ×2
ELECTRODE REM PT RTRN 9FT ADLT (ELECTROSURGICAL) ×1 IMPLANT
GAUZE VASELINE 3X9 (GAUZE/BANDAGES/DRESSINGS) IMPLANT
GLOVE BIOGEL PI IND STRL 7.0 (GLOVE) ×5 IMPLANT
GLOVE BIOGEL PI INDICATOR 7.0 (GLOVE) ×5
GLOVE ECLIPSE 6.5 STRL STRAW (GLOVE) ×6 IMPLANT
HEMOSTAT ARISTA ABSORB 3G PWDR (MISCELLANEOUS) IMPLANT
KIT ACCESSORY DA VINCI DISP (KITS) ×1
KIT ACCESSORY DVNC DISP (KITS) ×1 IMPLANT
LEGGING LITHOTOMY PAIR STRL (DRAPES) ×2 IMPLANT
NEEDLE INSUFFLATION 150MM (ENDOMECHANICALS) ×2 IMPLANT
OCCLUDER COLPOPNEUMO (BALLOONS) ×2 IMPLANT
PACK ROBOT WH (CUSTOM PROCEDURE TRAY) ×2 IMPLANT
PACK ROBOTIC GOWN (GOWN DISPOSABLE) ×2 IMPLANT
PACK TRENDGUARD 450 HYBRID PRO (MISCELLANEOUS) IMPLANT
PACK TRENDGUARD 600 HYBRD PROC (MISCELLANEOUS) IMPLANT
PAD PREP 24X48 CUFFED NSTRL (MISCELLANEOUS) ×2 IMPLANT
PROTECTOR NERVE ULNAR (MISCELLANEOUS) ×4 IMPLANT
SET CYSTO W/LG BORE CLAMP LF (SET/KITS/TRAYS/PACK) IMPLANT
SET IRRIG TUBING LAPAROSCOPIC (IRRIGATION / IRRIGATOR) ×2 IMPLANT
SET TRI-LUMEN FLTR TB AIRSEAL (TUBING) ×2 IMPLANT
SUT MNCRL AB 4-0 PS2 18 (SUTURE) ×4 IMPLANT
SUT VIC AB 0 CT1 36 (SUTURE) ×4 IMPLANT
SUT VICRYL 0 UR6 27IN ABS (SUTURE) ×2 IMPLANT
SUT VLOC 180 0 9IN  GS21 (SUTURE) ×1
SUT VLOC 180 0 9IN GS21 (SUTURE) IMPLANT
SYR 50ML LL SCALE MARK (SYRINGE) ×3 IMPLANT
SYRINGE 10CC LL (SYRINGE) ×2 IMPLANT
TIP RUMI ORANGE 6.7MMX12CM (TIP) IMPLANT
TIP UTERINE 5.1X6CM LAV DISP (MISCELLANEOUS) IMPLANT
TIP UTERINE 6.7X10CM GRN DISP (MISCELLANEOUS) ×1 IMPLANT
TIP UTERINE 6.7X6CM WHT DISP (MISCELLANEOUS) IMPLANT
TIP UTERINE 6.7X8CM BLUE DISP (MISCELLANEOUS) IMPLANT
TOWEL OR 17X24 6PK STRL BLUE (TOWEL DISPOSABLE) ×6 IMPLANT
TRENDGUARD 450 HYBRID PRO PACK (MISCELLANEOUS) ×2
TRENDGUARD 600 HYBRID PROC PK (MISCELLANEOUS)
TROCAR DISP BLADELESS 8 DVNC (TROCAR) ×1 IMPLANT
TROCAR DISP BLADELESS 8MM (TROCAR) ×1
TROCAR PORT AIRSEAL 8X120 (TROCAR) ×2 IMPLANT
TROCAR Z-THREAD 12X150 (TROCAR) ×2 IMPLANT

## 2016-09-13 NOTE — Anesthesia Preprocedure Evaluation (Signed)
Anesthesia Evaluation  Patient identified by MRN, date of birth, ID band Patient awake    Reviewed: Allergy & Precautions, NPO status , Patient's Chart, lab work & pertinent test results  Airway Mallampati: I  TM Distance: >3 FB Neck ROM: Full    Dental no notable dental hx.    Pulmonary neg pulmonary ROS,    Pulmonary exam normal breath sounds clear to auscultation       Cardiovascular negative cardio ROS Normal cardiovascular exam Rhythm:Regular Rate:Normal     Neuro/Psych negative neurological ROS  negative psych ROS   GI/Hepatic negative GI ROS, Neg liver ROS,   Endo/Other  negative endocrine ROS  Renal/GU negative Renal ROS  negative genitourinary   Musculoskeletal negative musculoskeletal ROS (+)   Abdominal   Peds negative pediatric ROS (+)  Hematology negative hematology ROS (+)   Anesthesia Other Findings   Reproductive/Obstetrics negative OB ROS                             Anesthesia Physical Anesthesia Plan  ASA: II  Anesthesia Plan: General   Post-op Pain Management:    Induction: Intravenous  PONV Risk Score and Plan: 2 and Ondansetron, Dexamethasone and Scopolamine patch - Pre-op  Airway Management Planned: Oral ETT  Additional Equipment:   Intra-op Plan:   Post-operative Plan: Extubation in OR  Informed Consent: I have reviewed the patients History and Physical, chart, labs and discussed the procedure including the risks, benefits and alternatives for the proposed anesthesia with the patient or authorized representative who has indicated his/her understanding and acceptance.   Dental advisory given  Plan Discussed with: CRNA  Anesthesia Plan Comments: (  )        Anesthesia Quick Evaluation

## 2016-09-13 NOTE — Brief Op Note (Signed)
09/13/2016  5:57 PM  PATIENT:  Robin Blackwell  46 y.o. female  PRE-OPERATIVE DIAGNOSIS:  Symptomatic Uterine Fibroids, prev myomectomy  POST-OPERATIVE DIAGNOSIS:  Symptomatic Uterine Fibroids, prev myomectomy  PROCEDURE:  Procedure(s): ROBOTIC ASSISTED TOTAL HYSTERECTOMY WITH SALPINGECTOMY (Bilateral)  SURGEON:  Surgeon(s) and Role:    * Servando Salina, MD - Primary      PHYSICIAN ASSISTANT:   ASSISTANTS: Tiana Loft, MD   ANESTHESIA:   general Findings: nl tubes, fibroid uterus, nl ovaries( sl cystic), nl liver edge,  EBL:  Total I/O In: 2000 [I.V.:2000] Out: 250 [Urine:200; Blood:50]  BLOOD ADMINISTERED:none  DRAINS: none   LOCAL MEDICATIONS USED:  MARCAINE     SPECIMEN:  Source of Specimen:  uterus with cervix, tubes  DISPOSITION OF SPECIMEN:  PATHOLOGY  COUNTS:  YES  TOURNIQUET:  * No tourniquets in log *  DICTATION: .Other Dictation: Dictation Number [id number]  PLAN OF CARE: Other Dictation: Dictation Number [id number]  PATIENT DISPOSITION:  PACU - hemodynamically stable.   Delay start of Pharmacological VTE agent (>24hrs) due to surgical blood loss or risk of bleeding: no

## 2016-09-13 NOTE — Progress Notes (Signed)
Family updated.  Waiting CRNA to start IV.

## 2016-09-13 NOTE — Transfer of Care (Signed)
Immediate Anesthesia Transfer of Care Note  Patient: Robin Blackwell  Procedure(s) Performed: Procedure(s): ROBOTIC ASSISTED TOTAL HYSTERECTOMY WITH SALPINGECTOMY (Bilateral)  Patient Location: PACU  Anesthesia Type:General  Level of Consciousness: awake, alert  and oriented  Airway & Oxygen Therapy: Patient Spontanous Breathing and Patient connected to nasal cannula oxygen  Post-op Assessment: Report given to RN and Post -op Vital signs reviewed and stable  Post vital signs: Reviewed and stable  Last Vitals:  Vitals:   09/13/16 1129  BP: (!) 154/98  Pulse: 85  Resp: 18  Temp: 37 C    Last Pain:  Vitals:   09/13/16 1129  TempSrc: Oral  PainSc: 10-Worst pain ever      Patients Stated Pain Goal: 3 (18/28/83 3744)  Complications: No apparent anesthesia complications

## 2016-09-13 NOTE — Anesthesia Postprocedure Evaluation (Signed)
Anesthesia Post Note  Patient: Robin Blackwell  Procedure(s) Performed: Procedure(s) (LRB): ROBOTIC ASSISTED TOTAL HYSTERECTOMY WITH SALPINGECTOMY (Bilateral)     Patient location during evaluation: PACU Anesthesia Type: General Level of consciousness: sedated and patient cooperative Pain management: pain level controlled Vital Signs Assessment: post-procedure vital signs reviewed and stable Respiratory status: spontaneous breathing Cardiovascular status: stable Anesthetic complications: no    Last Vitals:  Vitals:   09/13/16 1900 09/13/16 1910  BP: 114/71 134/80  Pulse: 70 82  Resp: 15 20  Temp:      Last Pain:  Vitals:   09/13/16 1910  TempSrc:   PainSc: 0-No pain   Pain Goal: Patients Stated Pain Goal: 3 (09/13/16 1129)               Nolon Nations

## 2016-09-13 NOTE — Anesthesia Procedure Notes (Signed)
Procedure Name: Intubation Date/Time: 09/13/2016 2:12 PM Performed by: Jonna Munro Pre-anesthesia Checklist: Patient identified, Emergency Drugs available, Suction available, Timeout performed and Patient being monitored Patient Re-evaluated:Patient Re-evaluated prior to inductionOxygen Delivery Method: Circle system utilized Preoxygenation: Pre-oxygenation with 100% oxygen Intubation Type: IV induction Ventilation: Mask ventilation without difficulty Laryngoscope Size: Mac and 3 Grade View: Grade I Tube type: Oral Tube size: 7.0 mm Number of attempts: 1 Airway Equipment and Method: Stylet Placement Confirmation: ETT inserted through vocal cords under direct vision,  positive ETCO2 and breath sounds checked- equal and bilateral Secured at: 22 cm Tube secured with: Tape Dental Injury: Teeth and Oropharynx as per pre-operative assessment

## 2016-09-14 ENCOUNTER — Encounter (HOSPITAL_COMMUNITY): Payer: Self-pay | Admitting: Obstetrics and Gynecology

## 2016-09-14 DIAGNOSIS — N938 Other specified abnormal uterine and vaginal bleeding: Secondary | ICD-10-CM | POA: Diagnosis not present

## 2016-09-14 DIAGNOSIS — N946 Dysmenorrhea, unspecified: Secondary | ICD-10-CM | POA: Diagnosis not present

## 2016-09-14 DIAGNOSIS — D259 Leiomyoma of uterus, unspecified: Secondary | ICD-10-CM | POA: Diagnosis not present

## 2016-09-14 DIAGNOSIS — N920 Excessive and frequent menstruation with regular cycle: Secondary | ICD-10-CM | POA: Diagnosis not present

## 2016-09-14 LAB — CBC
HCT: 32.9 % — ABNORMAL LOW (ref 36.0–46.0)
Hemoglobin: 10.8 g/dL — ABNORMAL LOW (ref 12.0–15.0)
MCH: 30.2 pg (ref 26.0–34.0)
MCHC: 32.8 g/dL (ref 30.0–36.0)
MCV: 91.9 fL (ref 78.0–100.0)
Platelets: 212 10*3/uL (ref 150–400)
RBC: 3.58 MIL/uL — ABNORMAL LOW (ref 3.87–5.11)
RDW: 14 % (ref 11.5–15.5)
WBC: 13.7 10*3/uL — ABNORMAL HIGH (ref 4.0–10.5)

## 2016-09-14 LAB — BASIC METABOLIC PANEL
Anion gap: 4 — ABNORMAL LOW (ref 5–15)
BUN: 8 mg/dL (ref 6–20)
CO2: 25 mmol/L (ref 22–32)
Calcium: 8.6 mg/dL — ABNORMAL LOW (ref 8.9–10.3)
Chloride: 107 mmol/L (ref 101–111)
Creatinine, Ser: 0.69 mg/dL (ref 0.44–1.00)
GFR calc Af Amer: >60 mL/min (ref >60)
GFR calc non Af Amer: >60 mL/min (ref >60)
Glucose, Bld: 97 mg/dL (ref 65–99)
Potassium: 3.7 mmol/L (ref 3.5–5.1)
Sodium: 136 mmol/L (ref 135–145)

## 2016-09-14 MED ORDER — TRAMADOL HCL 50 MG PO TABS: 50.0000 mg | ORAL_TABLET | Freq: Four times a day (QID) | ORAL | 0 refills | Status: DC | PRN

## 2016-09-14 NOTE — Op Note (Signed)
NAME:  Robin Blackwell, SANTOR NO.:  MEDICAL RECORD NO.:  [id number]  LOCATION:                                 FACILITY:  PHYSICIAN:  Servando Salina, M.D.    DATE OF BIRTH:  DATE OF PROCEDURE:  09/13/2016 DATE OF DISCHARGE:                              OPERATIVE REPORT   PREOPERATIVE DIAGNOSES: 1. Symptomatic uterine fibroids. 2. Previous myomectomy.  PROCEDURES: Da Vinci robotic total hysterectomy, Bilateral salpingectomy.  POSTOPERATIVE DIAGNOSES: 1. Symptomatic uterine fibroids. 2. Previous myomectomy.  ANESTHESIA:  General.  SURGEON:  Servando Salina, M.D.  ASSISTANT:  Tiana Loft, M.D.  DESCRIPTION OF PROCEDURE:  Under adequate general anesthesia, the patient was placed in the dorsal lithotomy position.  She was sterilely prepped and draped in usual fashion.  Examination under anesthesia revealed about a 12-week size uterus.  No adnexal masses could be appreciated.  A 3-way Foley catheter was sterilely placed.  Weighted speculum was placed in the vagina.  Sims retractor was placed anteriorly.  Zero Vicryl figure-of-eight sutures placed on the anterior and on the posterior lip of the cervix.  The uterus sounded to 10 cm.  A #10 uterine manipulator with a medium KOH ring was introduced into the uterine cavity without incident.  Retractors were removed. Attention was then turned to the abdomen.  Marcaine 0.25% was injected along the planned supraumbilical site.  A vertical incision was made.  Veress needle was introduced.  Water drop test for placement was done.  The CO2 was insufflated.  2.3 L CO2 was done and opening pressure of 1 was noted. Veress needle was then removed.  A 12 mm disposable trocar with sleeve was introduced in the abdomen without incident.  A lighted videolaparoscope was then inserted with the robotic camera.  Panoramic inspection showed an uneventful entry into the abdomen.  Normal liver edge.  The patient was placed  in a deep Trendelenburg position.  The uterus was noted to be irregularly enlarged with fibroids, wide based, and both adnexa were prominent proximally.  The patient with her prior robotic myomectomy, those incisions on the left were injected with 0.25% Marcaine and then used where #8 mm robotic ports were placed sequentially through those sites.  On the right in the right lower quadrant, local anesthesia with 0.25% Marcaine was injected.  Incision was made and an 8 mm AirSeal was placed and in between the supraumbilical port site and that AirSeal port, another 8 mm robotic port was placed under direct visualization. At that point, the robot was docked to the patient's left side.  In arm #1, were the monopolar scissors.  Arm #2, the PK dissector.  Arm #3, ProGrasp.  At that point, I then went to the surgical console.  At the surgical console, the pelvis was further inspected.  Both ureters were seen, peristalsing very well.  There was a right lateral fibroid and partially extending into the broad ligament and both ovaries were slightly cystic, but otherwise normal.  Tubes were normal.  There was a small Allen-Masters window in the posterior cul-de-sac.  No other lesions were noted.  The procedure was started on the right by  grasping the fallopian tube and proximally and serially and sequentially cauterized and then cutting the mesosalpinx with removal of that tube. The retroperitoneal space was then opened on the right.  A window was placed in that on the medial leaf and the right uteroovarian ligament was serially clamped, cauterized, and then cut.  The vesicouterine peritoneum was opened anteriorly and fibroid in the right lateral was moved superiorly.  The vessels were quite prominent on the right.  Once they were isolated, those uterine vessels were then serially clamped, cauterized, but not cut.  Attention was then turned to the opposite side where the same procedure was performed with  respect to the fallopian tube, opening of the left retroperitoneal space and between the window in the medial leaf of the broad ligament with the left utero-ovarian ligament being serially clamped, cauterized, and then cut.  The remaining portion of the vesicouterine peritoneum was then opened anteriorly and the bladder was further sharply dissected and displaced inferiorly.  Uterine vessels were identified on the left, serially clamped, cauterized, and then cut.  Attention was then turned back to the right uterine vessels which again at that point were not as prominent.  They were continued to be serially clamped, cauterized, and then cut.  The vaginal insufflator was then performed and the bladder was further displaced inferiorly.  A circumferential incision was made at the cervical vaginal junction at the upper part of the KOH ring. This resulted in the uterus being severed from its vaginal attachment. Initial attempt at removing the large uterus was challenging and therefore I left the console and went back to the patient where I was able to with retractors, subsequently removed the uterus intact. The attention was then back to the console and the abdomen.  The vaginal cuff was inspected.  Small bleeders cauterized.  The instruments were then exchanged with the monopolar scissors being replaced by the large suture needle driver and the PK being replaced by the long tip forceps.  Zero V-Loc suture was then utilized to close the vaginal cuff and oversewn with the same V-Loc suture.  Ureters were then inspected, both were peristalsing well.  The pedicles were inspected and cauterized on the left, remained otherwise intact bilaterally.  The pelvis was irrigated and suctioned.  The procedure, at that point, was felt to be complete.  The robotic instruments were removed.  The robot was undocked.  I went back sterilely to the patient's bedside.  Reinspection of the upper abdomen as well as  the pelvis showed good hemostasis.  The bupivacaine was placed in the pelvis for postop pain management.  The robotic port sites were then removed.  The AirSeal was removed last and the supraumbilical fascial site was then identified.  0 Vicryl figure-of- eight suture was placed and the incisions all were then closed with 4-0 Monocryl subcuticular closures and Dermabond as well.  The vaginal cuff was digitally palpated with good approximation noted.  SPECIMEN:  Uterus with cervix and fallopian tube. weighed 320g   INTRAOPERATIVE FLUID:  2 L.  URINE OUTPUT:  200 mL.  ESTIMATED BLOOD LOSS:  50 mL.  COMPLICATIONS:  None.  DISPOSITION/RECOVERY:  The patient tolerated the procedure well, was transferred to the recovery  Room in stable condition.     Servando Salina, M.D.     [place]/MEDQ  D:  09/13/2016  T:  09/14/2016  Job:  [id number]

## 2016-09-14 NOTE — Progress Notes (Signed)
Pt.is discharged in the care of parents, Downstairs per wheelchair with N.T escort. Denies any pain or discomfort. States she understands all instructions for discharge Stable. All lap sites incision are clean and dry

## 2016-09-14 NOTE — Progress Notes (Signed)
Subjective: Patient reports tolerating PO and no problems voiding.    Objective: I have reviewed patient's vital signs.  vital signs and labs. Vitals:   09/14/16 0400 09/14/16 0759  BP: 131/76 131/66  Pulse: 68 69  Resp: 18 18  Temp: 99.5 F (37.5 C) 98.4 F (36.9 C)   I/O last 3 completed shifts: In: 2000 [I.V.:2000] Out: 1400 [Urine:1350; Blood:50] Total I/O In: -  Out: 250 [Urine:250]  Lab Results  Component Value Date   WBC 13.7 (H) 09/14/2016   HGB 10.8 (L) 09/14/2016   HCT 32.9 (L) 09/14/2016   MCV 91.9 09/14/2016   PLT 212 09/14/2016   Lab Results  Component Value Date   CREATININE 0.69 09/14/2016    EXAM General: alert, cooperative and no distress Resp: clear to auscultation bilaterally Cardio: regular rate and rhythm, S1, S2 normal, no murmur, click, rub or gallop GI: soft, non-tender; bowel sounds normal; no masses,  no organomegaly and incision: clean, dry and intact Extremities: no edema, redness or tenderness in the calves or thighs Vaginal Bleeding: none  Assessment: s/p Procedure(s): ROBOTIC ASSISTED TOTAL HYSTERECTOMY WITH SALPINGECTOMY: stable, progressing well and tolerating diet  Plan: Advance diet Encourage ambulation Discontinue IV fluids Discharge home  D/c instructions reviewed. F/u 2 weeks  LOS: 0 days    Robin Blackwell A, MD 09/14/2016 8:33 AM    09/14/2016, 8:33 AM

## 2016-09-14 NOTE — Discharge Summary (Signed)
Physician Discharge Summary  Patient ID: Robin Blackwell MRN: [id number] DOB/AGE: [birth year] 46 y.o.  Admit date: 09/13/2016 Discharge date: 09/14/2016  Admission Diagnoses: symptomatic uterine fibroids, previous myomectomy  Discharge Diagnoses: same Active Problems:   S/P total hysterectomy   Discharged Condition: stable  Hospital Course: pt underwent Davinci robotic total hysterectomy, bilateral salpingectomy. Uncomplicated postop course  Consults: None  Significant Diagnostic Studies: labs:  CBC    Component Value Date/Time   WBC 13.7 (H) 09/14/2016 0518   RBC 3.58 (L) 09/14/2016 0518   HGB 10.8 (L) 09/14/2016 0518   HCT 32.9 (L) 09/14/2016 0518   PLT 212 09/14/2016 0518   MCV 91.9 09/14/2016 0518   MCH 30.2 09/14/2016 0518   MCHC 32.8 09/14/2016 0518   RDW 14.0 09/14/2016 0518   LYMPHSABS 1.7 09/07/2013 1136   MONOABS 0.4 09/07/2013 1136   EOSABS 0.1 09/07/2013 1136   BASOSABS 0.0 09/07/2013 1136   CMP     Component Value Date/Time   NA 136 09/14/2016 0518   K 3.7 09/14/2016 0518   CL 107 09/14/2016 0518   CO2 25 09/14/2016 0518   GLUCOSE 97 09/14/2016 0518   BUN 8 09/14/2016 0518   CREATININE 0.69 09/14/2016 0518   CALCIUM 8.6 (L) 09/14/2016 0518   PROT 7.9 02/10/2015 2223   ALBUMIN 3.9 02/10/2015 2223   AST 23 02/10/2015 2223   ALT 18 02/10/2015 2223   ALKPHOS 32 (L) 02/10/2015 2223   BILITOT 0.6 02/10/2015 2223   GFRNONAA >60 09/14/2016 0518   GFRAA >60 09/14/2016 0518    Treatments: surgery: Da Vinci robotic total hysterectomy. Bilateral salpingectomy  Discharge Exam: Blood pressure 131/66, pulse 69, temperature 98.4 F (36.9 C), temperature source Oral, resp. rate 18, height 5\' 7"  (1.702 m), weight 85.7 kg (189 lb), last menstrual period 08/27/2016, SpO2 96 %. General appearance: alert, cooperative and no distress Resp: clear to auscultation bilaterally Cardio: regular rate and rhythm, S1, S2 normal, no murmur, click, rub or gallop GI: soft,  non-tender; bowel sounds normal; no masses,  no organomegaly and incisions clean dry intact Pelvic: deferred Extremities: no edema, redness or tenderness in the calves or thighs Back no CVAT  Disposition: 07-Left Against Medical Advice/Left Without Being Seen/Elopement  Discharge Instructions    Call MD for:  persistant nausea and vomiting    Complete by:  As directed    Call MD for:  severe uncontrolled pain    Complete by:  As directed    Call MD for:  temperature >100.4    Complete by:  As directed    Diet general    Complete by:  As directed    Discharge instructions    Complete by:  As directed    Call if temperature greater than equal to 100.4, nothing per vagina for 4-6 weeks or severe nausea vomiting, increased incisional pain , drainage or redness in the incision site, no straining with bowel movements, showers no bath   May walk up steps    Complete by:  As directed      Allergies as of 09/14/2016   No Known Allergies     Medication List    STOP taking these medications   FERRALET 90 90-1 MG Tabs   shark liver oil-cocoa butter 0.25-3-85.5 % suppository Commonly known as:  PREPARATION H     TAKE these medications   Biotin 1000 MCG tablet Take 1,000 mcg by mouth See admin instructions. Takes 3 times a month   clindamycin 1 % lotion Commonly  known as:  CLEOCIN T Apply 1 application topically daily as needed. Acne   docusate sodium 100 MG capsule Commonly known as:  COLACE Take 100-200 mg by mouth every other day.   traMADol 50 MG tablet Commonly known as:  ULTRAM Take 1 tablet (50 mg total) by mouth every 6 (six) hours as needed.      Follow-up Information    Servando Salina, MD Follow up in 2 week(s).   Specialty:  Obstetrics and Gynecology Contact information: [address] [place] [address] [phone]           Signed: Mariano Doshi A 09/14/2016, 8:37 AM

## 2016-09-22 ENCOUNTER — Inpatient Hospital Stay (HOSPITAL_COMMUNITY)
Admission: AD | Admit: 2016-09-22 | Discharge: 2016-09-22 | Disposition: A | Discharge status: Home / Self Care | Payer: BLUE CROSS/BLUE SHIELD | Source: Ambulatory Visit | Attending: Obstetrics and Gynecology | Admitting: Obstetrics and Gynecology

## 2016-09-22 ENCOUNTER — Encounter (HOSPITAL_COMMUNITY): Payer: Self-pay | Admitting: *Deleted

## 2016-09-22 DIAGNOSIS — R51 Headache: Secondary | ICD-10-CM | POA: Diagnosis not present

## 2016-09-22 DIAGNOSIS — Z7982 Long term (current) use of aspirin: Secondary | ICD-10-CM | POA: Insufficient documentation

## 2016-09-22 DIAGNOSIS — R110 Nausea: Secondary | ICD-10-CM | POA: Diagnosis not present

## 2016-09-22 DIAGNOSIS — Z79899 Other long term (current) drug therapy: Secondary | ICD-10-CM | POA: Diagnosis not present

## 2016-09-22 DIAGNOSIS — R05 Cough: Secondary | ICD-10-CM | POA: Insufficient documentation

## 2016-09-22 DIAGNOSIS — I10 Essential (primary) hypertension: Secondary | ICD-10-CM | POA: Insufficient documentation

## 2016-09-22 DIAGNOSIS — J069 Acute upper respiratory infection, unspecified: Secondary | ICD-10-CM

## 2016-09-22 LAB — URINALYSIS, ROUTINE W REFLEX MICROSCOPIC
Bilirubin Urine: NEGATIVE
Glucose, UA: NEGATIVE mg/dL
Hgb urine dipstick: NEGATIVE
Ketones, ur: NEGATIVE mg/dL
Leukocytes, UA: NEGATIVE
Nitrite: NEGATIVE
Protein, ur: NEGATIVE mg/dL
Specific Gravity, Urine: 1.005 (ref 1.005–1.030)
pH: 5 (ref 5.0–8.0)

## 2016-09-22 LAB — CBC WITH DIFFERENTIAL/PLATELET
Basophils Absolute: 0 10*3/uL (ref 0.0–0.1)
Basophils Relative: 0 %
Eosinophils Absolute: 0.7 10*3/uL (ref 0.0–0.7)
Eosinophils Relative: 8 %
HCT: 36.3 % (ref 36.0–46.0)
Hemoglobin: 12.1 g/dL (ref 12.0–15.0)
Lymphocytes Relative: 29 %
Lymphs Abs: 2.4 10*3/uL (ref 0.7–4.0)
MCH: 30.2 pg (ref 26.0–34.0)
MCHC: 33.3 g/dL (ref 30.0–36.0)
MCV: 90.5 fL (ref 78.0–100.0)
Monocytes Absolute: 0.2 10*3/uL (ref 0.1–1.0)
Monocytes Relative: 3 %
Neutro Abs: 5 10*3/uL (ref 1.7–7.7)
Neutrophils Relative %: 60 %
Platelets: 226 10*3/uL (ref 150–400)
RBC: 4.01 MIL/uL (ref 3.87–5.11)
RDW: 13.5 % (ref 11.5–15.5)
WBC: 8.3 10*3/uL (ref 4.0–10.5)

## 2016-09-22 MED ORDER — BUTALBITAL-APAP-CAFFEINE 50-325-40 MG PO TABS: 2.0000 | ORAL_TABLET | Freq: Once | ORAL | 0 refills | Status: AC

## 2016-09-22 MED ORDER — GUAIFENESIN ER 600 MG PO TB12
600.0000 mg | ORAL_TABLET | Freq: Two times a day (BID) | ORAL | Status: DC
  Filled 2016-09-22 (×2): qty 1

## 2016-09-22 MED ORDER — BUTALBITAL-APAP-CAFFEINE 50-325-40 MG PO TABS
2.0000 | ORAL_TABLET | Freq: Once | ORAL | Status: AC
  Administered 2016-09-22: 2 via ORAL
  Filled 2016-09-22: qty 2

## 2016-09-22 MED ORDER — BENZONATATE 100 MG PO CAPS: 100.0000 mg | ORAL_CAPSULE | Freq: Three times a day (TID) | ORAL | 0 refills | Status: DC | PRN

## 2016-09-22 MED ORDER — PROMETHAZINE HCL 25 MG PO TABS: 25.0000 mg | ORAL_TABLET | Freq: Four times a day (QID) | ORAL | 0 refills | Status: DC | PRN

## 2016-09-22 MED ORDER — PROMETHAZINE HCL 25 MG PO TABS
25.0000 mg | ORAL_TABLET | Freq: Once | ORAL | Status: AC
  Administered 2016-09-22: 25 mg via ORAL
  Filled 2016-09-22: qty 1

## 2016-09-22 NOTE — MAU Note (Signed)
Pt here with high blood pressure today. Had surgery a week ago; has not felt well today, "feeling loopy" and nauseated; had bad headache.

## 2016-09-22 NOTE — MAU Provider Note (Signed)
History     Chief Complaint  Patient presents with  . Hypertension    46 yo G2P1011 SBF s/p  Da Vinci robotic total hysterectomy, bilateral salpingectomy 7/3 presents with her  Mother due to concern about elevated BP( checked by pt at home using mother's machine) as well as headache. Pt has hx migraine. Pt took excedrin migraine med. Pt c/o nausea denies blurred vision or light sensitivity She denies any abdominal pain or vaginal bleeding. Pt had called office earlier and was directed to be seen in urgent care for her BP.  Pt's mother had dx of HTN after she underwent surgery and was concern that this is happening to her  Daughter. No hx of HTN prior to surgery. Pt also c/o cough for which she has been taking cough syrup  OB History    Gravida Para Term Preterm AB Living   2 1       1    SAB TAB Ectopic Multiple Live Births                  Past Medical History:  Diagnosis Date  . Anemia   . Endometriosis   . Migraine without aura, without mention of intractable migraine without mention of status migrainosus 09/25/2013  . Uterine fibroid     Past Surgical History:  Procedure Laterality Date  . ABDOMINAL EXPLORATION SURGERY    . HERNIA REPAIR     umbilical  . KNEE ARTHROSCOPY  2013   left  . MYOMECTOMY  02/2015  . ROBOTIC ASSISTED TOTAL HYSTERECTOMY WITH SALPINGECTOMY Bilateral 09/13/2016   Procedure: ROBOTIC ASSISTED TOTAL HYSTERECTOMY WITH SALPINGECTOMY;  Surgeon: Servando Salina, MD;  Location: [facility];  Service: Gynecology;  Laterality: Bilateral;    Family History  Problem Relation Age of Onset  . Hypertension Mother   . Alcoholism Father   . Migraines Father   . Stroke Maternal Grandmother   . Breast cancer Paternal Grandmother   . Alcoholism Paternal Grandfather     Social History  Substance Use Topics  . Smoking status: Never Smoker  . Smokeless tobacco: Never Used  . Alcohol use No    Allergies: No Known Allergies  Prescriptions Prior to Admission   Medication Sig Dispense Refill Last Dose  . aspirin-acetaminophen-caffeine (EXCEDRIN MIGRAINE) 250-250-65 MG tablet Take 2 tablets by mouth every 6 (six) hours as needed for headache.   09/22/2016 at Unknown time  . Biotin 1000 MCG tablet Take 1,000 mcg by mouth See admin instructions. Takes 3 times a month   Past Month at Unknown time  . Chlorpheniramine-Acetaminophen (CORICIDIN HBP COLD/FLU PO) Take 1 tablet by mouth once.   09/22/2016 at Unknown time  . clindamycin (CLEOCIN T) 1 % lotion Apply 1 application topically daily as needed. Acne  12 Past Month at Unknown time  . docusate sodium (COLACE) 100 MG capsule Take 100-200 mg by mouth every other day.   Past Week at Unknown time  . guaifenesin (ROBITUSSIN) 100 MG/5ML syrup Take 200 mg by mouth 3 (three) times daily as needed for cough.   09/22/2016 at Unknown time  . oxyCODONE-acetaminophen (PERCOCET/ROXICET) 5-325 MG tablet Take 1 tablet by mouth every 6 (six) hours as needed for moderate pain. for pain  0 Past Week at Unknown time  . traMADol (ULTRAM) 50 MG tablet Take 1 tablet (50 mg total) by mouth every 6 (six) hours as needed. (Patient taking differently: Take 50 mg by mouth every 6 (six) hours as needed for moderate pain. ) 30  tablet 0 Past Week at Unknown time     Physical Exam   Blood pressure (!) 146/95, pulse 85, temperature 97.9 F (36.6 C), temperature source Oral, resp. rate 18, height 5\' 7"  (1.702 m), weight 84.4 kg (186 lb), last menstrual period 08/27/2016, SpO2 98 %. General: WDWN black female in NAD  No exam performed today, c/o not related to surgery/postop. ED Course  Headache poss migraine New onset HTN P)fioricet. Phenergan for nausea.  Tessalon perles script. Stop cough syrup . Pt to see PCP/urgent care for mgmt of BP. D/c home Keep scheduled f/u postop appt 7/16 MDM   Ita Fritzsche A, MD 8:38 PM 09/22/2016

## 2016-09-24 DIAGNOSIS — I10 Essential (primary) hypertension: Secondary | ICD-10-CM | POA: Diagnosis not present

## 2016-10-01 DIAGNOSIS — I10 Essential (primary) hypertension: Secondary | ICD-10-CM | POA: Diagnosis not present

## 2016-10-18 DIAGNOSIS — Z130 Encounter for screening for diseases of the blood and blood-forming organs and certain disorders involving the immune mechanism: Secondary | ICD-10-CM | POA: Diagnosis not present

## 2016-10-18 DIAGNOSIS — Z09 Encounter for follow-up examination after completed treatment for conditions other than malignant neoplasm: Secondary | ICD-10-CM | POA: Diagnosis not present

## 2016-10-25 DIAGNOSIS — I10 Essential (primary) hypertension: Secondary | ICD-10-CM | POA: Diagnosis not present

## 2016-10-25 DIAGNOSIS — G43009 Migraine without aura, not intractable, without status migrainosus: Secondary | ICD-10-CM | POA: Diagnosis not present

## 2016-11-23 DIAGNOSIS — G43009 Migraine without aura, not intractable, without status migrainosus: Secondary | ICD-10-CM | POA: Diagnosis not present

## 2016-11-23 DIAGNOSIS — I10 Essential (primary) hypertension: Secondary | ICD-10-CM | POA: Diagnosis not present

## 2016-11-29 DIAGNOSIS — G43009 Migraine without aura, not intractable, without status migrainosus: Secondary | ICD-10-CM | POA: Diagnosis not present

## 2016-11-29 DIAGNOSIS — I10 Essential (primary) hypertension: Secondary | ICD-10-CM | POA: Diagnosis not present

## 2017-03-03 DIAGNOSIS — B373 Candidiasis of vulva and vagina: Secondary | ICD-10-CM | POA: Diagnosis not present

## 2017-03-03 DIAGNOSIS — N760 Acute vaginitis: Secondary | ICD-10-CM | POA: Diagnosis not present

## 2017-04-15 DIAGNOSIS — Z01419 Encounter for gynecological examination (general) (routine) without abnormal findings: Secondary | ICD-10-CM | POA: Diagnosis not present

## 2017-04-15 DIAGNOSIS — Z6829 Body mass index (BMI) 29.0-29.9, adult: Secondary | ICD-10-CM | POA: Diagnosis not present

## 2017-04-15 DIAGNOSIS — Z1231 Encounter for screening mammogram for malignant neoplasm of breast: Secondary | ICD-10-CM | POA: Diagnosis not present

## 2017-04-20 ENCOUNTER — Other Ambulatory Visit: Payer: Self-pay | Admitting: Obstetrics and Gynecology

## 2017-04-20 DIAGNOSIS — R1084 Generalized abdominal pain: Secondary | ICD-10-CM

## 2017-04-27 ENCOUNTER — Ambulatory Visit
Admission: RE | Admit: 2017-04-27 | Discharge: 2017-04-27 | Disposition: A | Discharge status: Home / Self Care | Payer: BLUE CROSS/BLUE SHIELD | Source: Ambulatory Visit | Attending: Obstetrics and Gynecology | Admitting: Obstetrics and Gynecology

## 2017-04-27 DIAGNOSIS — R1084 Generalized abdominal pain: Secondary | ICD-10-CM | POA: Diagnosis not present

## 2017-05-20 DIAGNOSIS — N390 Urinary tract infection, site not specified: Secondary | ICD-10-CM | POA: Diagnosis not present

## 2017-05-20 DIAGNOSIS — I10 Essential (primary) hypertension: Secondary | ICD-10-CM | POA: Diagnosis not present

## 2017-05-20 DIAGNOSIS — Z131 Encounter for screening for diabetes mellitus: Secondary | ICD-10-CM | POA: Diagnosis not present

## 2017-05-20 DIAGNOSIS — Z0000 Encounter for general adult medical examination without abnormal findings: Secondary | ICD-10-CM | POA: Diagnosis not present

## 2017-05-30 DIAGNOSIS — G43009 Migraine without aura, not intractable, without status migrainosus: Secondary | ICD-10-CM | POA: Diagnosis not present

## 2017-05-30 DIAGNOSIS — I10 Essential (primary) hypertension: Secondary | ICD-10-CM | POA: Diagnosis not present

## 2017-05-30 DIAGNOSIS — Z0000 Encounter for general adult medical examination without abnormal findings: Secondary | ICD-10-CM | POA: Diagnosis not present

## 2018-02-07 DIAGNOSIS — I10 Essential (primary) hypertension: Secondary | ICD-10-CM | POA: Diagnosis not present

## 2018-02-07 DIAGNOSIS — J0100 Acute maxillary sinusitis, unspecified: Secondary | ICD-10-CM | POA: Diagnosis not present

## 2018-02-07 DIAGNOSIS — R05 Cough: Secondary | ICD-10-CM | POA: Diagnosis not present

## 2018-02-07 DIAGNOSIS — R110 Nausea: Secondary | ICD-10-CM | POA: Diagnosis not present

## 2018-02-27 DIAGNOSIS — J029 Acute pharyngitis, unspecified: Secondary | ICD-10-CM | POA: Diagnosis not present

## 2018-02-27 DIAGNOSIS — J0100 Acute maxillary sinusitis, unspecified: Secondary | ICD-10-CM | POA: Diagnosis not present

## 2018-02-27 DIAGNOSIS — H6983 Other specified disorders of Eustachian tube, bilateral: Secondary | ICD-10-CM | POA: Diagnosis not present

## 2018-05-13 DIAGNOSIS — H40033 Anatomical narrow angle, bilateral: Secondary | ICD-10-CM | POA: Diagnosis not present

## 2018-05-13 DIAGNOSIS — H04123 Dry eye syndrome of bilateral lacrimal glands: Secondary | ICD-10-CM | POA: Diagnosis not present

## 2018-07-10 DIAGNOSIS — Z6827 Body mass index (BMI) 27.0-27.9, adult: Secondary | ICD-10-CM | POA: Diagnosis not present

## 2018-07-10 DIAGNOSIS — Z01419 Encounter for gynecological examination (general) (routine) without abnormal findings: Secondary | ICD-10-CM | POA: Diagnosis not present

## 2018-08-22 DIAGNOSIS — J301 Allergic rhinitis due to pollen: Secondary | ICD-10-CM | POA: Diagnosis not present

## 2018-08-22 DIAGNOSIS — R5383 Other fatigue: Secondary | ICD-10-CM | POA: Diagnosis not present

## 2018-08-22 DIAGNOSIS — L03811 Cellulitis of head [any part, except face]: Secondary | ICD-10-CM | POA: Diagnosis not present

## 2018-08-22 DIAGNOSIS — R05 Cough: Secondary | ICD-10-CM | POA: Diagnosis not present

## 2018-08-29 DIAGNOSIS — R05 Cough: Secondary | ICD-10-CM | POA: Diagnosis not present

## 2018-08-29 DIAGNOSIS — Z7189 Other specified counseling: Secondary | ICD-10-CM | POA: Diagnosis not present

## 2018-08-29 DIAGNOSIS — J301 Allergic rhinitis due to pollen: Secondary | ICD-10-CM | POA: Diagnosis not present

## 2018-08-29 DIAGNOSIS — L03811 Cellulitis of head [any part, except face]: Secondary | ICD-10-CM | POA: Diagnosis not present

## 2018-10-06 DIAGNOSIS — Z20828 Contact with and (suspected) exposure to other viral communicable diseases: Secondary | ICD-10-CM | POA: Diagnosis not present

## 2018-10-11 DIAGNOSIS — R0989 Other specified symptoms and signs involving the circulatory and respiratory systems: Secondary | ICD-10-CM | POA: Insufficient documentation

## 2018-10-11 DIAGNOSIS — K219 Gastro-esophageal reflux disease without esophagitis: Secondary | ICD-10-CM | POA: Insufficient documentation

## 2018-10-11 DIAGNOSIS — J310 Chronic rhinitis: Secondary | ICD-10-CM | POA: Diagnosis not present

## 2018-10-11 DIAGNOSIS — R053 Chronic cough: Secondary | ICD-10-CM | POA: Insufficient documentation

## 2018-10-11 DIAGNOSIS — R09A2 Foreign body sensation, throat: Secondary | ICD-10-CM | POA: Insufficient documentation

## 2018-10-11 DIAGNOSIS — J343 Hypertrophy of nasal turbinates: Secondary | ICD-10-CM | POA: Diagnosis not present

## 2018-10-25 DIAGNOSIS — Z03818 Encounter for observation for suspected exposure to other biological agents ruled out: Secondary | ICD-10-CM | POA: Diagnosis not present

## 2018-10-25 DIAGNOSIS — H02823 Cysts of right eye, unspecified eyelid: Secondary | ICD-10-CM | POA: Diagnosis not present

## 2018-12-07 DIAGNOSIS — L723 Sebaceous cyst: Secondary | ICD-10-CM | POA: Diagnosis not present

## 2019-01-08 DIAGNOSIS — I10 Essential (primary) hypertension: Secondary | ICD-10-CM | POA: Diagnosis not present

## 2019-01-08 DIAGNOSIS — Z0000 Encounter for general adult medical examination without abnormal findings: Secondary | ICD-10-CM | POA: Diagnosis not present

## 2019-01-15 DIAGNOSIS — Z7189 Other specified counseling: Secondary | ICD-10-CM | POA: Diagnosis not present

## 2019-01-15 DIAGNOSIS — K769 Liver disease, unspecified: Secondary | ICD-10-CM | POA: Diagnosis not present

## 2019-01-15 DIAGNOSIS — Z0000 Encounter for general adult medical examination without abnormal findings: Secondary | ICD-10-CM | POA: Diagnosis not present

## 2019-01-15 DIAGNOSIS — G43009 Migraine without aura, not intractable, without status migrainosus: Secondary | ICD-10-CM | POA: Diagnosis not present

## 2019-01-15 DIAGNOSIS — I10 Essential (primary) hypertension: Secondary | ICD-10-CM | POA: Diagnosis not present

## 2019-01-24 ENCOUNTER — Other Ambulatory Visit: Payer: Self-pay | Admitting: Internal Medicine

## 2019-01-24 DIAGNOSIS — K769 Liver disease, unspecified: Secondary | ICD-10-CM

## 2019-01-29 ENCOUNTER — Ambulatory Visit
Admission: RE | Admit: 2019-01-29 | Discharge: 2019-01-29 | Disposition: A | Discharge status: Home / Self Care | Payer: BC Managed Care – PPO | Source: Ambulatory Visit | Attending: Internal Medicine | Admitting: Internal Medicine

## 2019-01-29 DIAGNOSIS — K769 Liver disease, unspecified: Secondary | ICD-10-CM

## 2019-01-29 DIAGNOSIS — K7689 Other specified diseases of liver: Secondary | ICD-10-CM | POA: Diagnosis not present

## 2019-02-07 ENCOUNTER — Other Ambulatory Visit: Payer: Self-pay | Admitting: Internal Medicine

## 2019-02-07 DIAGNOSIS — K769 Liver disease, unspecified: Secondary | ICD-10-CM

## 2019-02-15 ENCOUNTER — Ambulatory Visit
Admission: RE | Admit: 2019-02-15 | Discharge: 2019-02-15 | Disposition: A | Discharge status: Home / Self Care | Payer: BC Managed Care – PPO | Source: Ambulatory Visit | Attending: Internal Medicine | Admitting: Internal Medicine

## 2019-02-15 ENCOUNTER — Other Ambulatory Visit: Payer: Self-pay

## 2019-02-15 DIAGNOSIS — D1809 Hemangioma of other sites: Secondary | ICD-10-CM | POA: Diagnosis not present

## 2019-02-15 DIAGNOSIS — K769 Liver disease, unspecified: Secondary | ICD-10-CM

## 2019-02-15 MED ORDER — GADOBENATE DIMEGLUMINE 529 MG/ML IV SOLN
17.0000 mL | Freq: Once | INTRAVENOUS | Status: AC | PRN
  Administered 2019-02-15: 17 mL via INTRAVENOUS

## 2019-06-20 ENCOUNTER — Other Ambulatory Visit: Payer: Self-pay | Admitting: Internal Medicine

## 2019-06-20 DIAGNOSIS — R1031 Right lower quadrant pain: Secondary | ICD-10-CM

## 2019-06-21 ENCOUNTER — Ambulatory Visit
Admission: RE | Admit: 2019-06-21 | Discharge: 2019-06-21 | Disposition: A | Discharge status: Home / Self Care | Payer: 59 | Source: Ambulatory Visit | Attending: Internal Medicine | Admitting: Internal Medicine

## 2019-06-21 DIAGNOSIS — R1031 Right lower quadrant pain: Secondary | ICD-10-CM

## 2019-07-09 ENCOUNTER — Other Ambulatory Visit: Payer: Self-pay

## 2019-07-09 ENCOUNTER — Encounter (INDEPENDENT_AMBULATORY_CARE_PROVIDER_SITE_OTHER): Payer: Self-pay | Admitting: Otolaryngology

## 2019-07-09 ENCOUNTER — Ambulatory Visit (INDEPENDENT_AMBULATORY_CARE_PROVIDER_SITE_OTHER): Payer: 59 | Admitting: Otolaryngology

## 2019-07-09 VITALS — Temp 97.5°F

## 2019-07-09 DIAGNOSIS — J310 Chronic rhinitis: Secondary | ICD-10-CM

## 2019-07-09 DIAGNOSIS — M26609 Unspecified temporomandibular joint disorder, unspecified side: Secondary | ICD-10-CM | POA: Diagnosis not present

## 2019-07-09 NOTE — Progress Notes (Signed)
HPI: Zareena Yau is a 49 y.o. female who presents for evaluation of ear complaints.  She feels like the ears are stuffy and some times cause discomfort.  She uses Flonase on a as needed basis.  She complains of pressure in the ears.  She has not had any significant change in her hearing.  Past Medical History:  Diagnosis Date  . Anemia   . Endometriosis   . Migraine without aura, without mention of intractable migraine without mention of status migrainosus 09/25/2013  . Uterine fibroid    Past Surgical History:  Procedure Laterality Date  . ABDOMINAL EXPLORATION SURGERY    . HERNIA REPAIR     umbilical  . KNEE ARTHROSCOPY  2013   left  . MYOMECTOMY  02/2015  . ROBOTIC ASSISTED TOTAL HYSTERECTOMY WITH SALPINGECTOMY Bilateral 09/13/2016   Procedure: ROBOTIC ASSISTED TOTAL HYSTERECTOMY WITH SALPINGECTOMY;  Surgeon: Servando Salina, MD;  Location: [facility];  Service: Gynecology;  Laterality: Bilateral;   Social History   Socioeconomic History  . Marital status: Single    Spouse name: Not on file  . Number of children: 1  . Years of education: MA  . Highest education level: Not on file  Occupational History  . Occupation: Professor    Fish farm manager: [employer]  Tobacco Use  . Smoking status: Never Smoker  . Smokeless tobacco: Never Used  Substance and Sexual Activity  . Alcohol use: No  . Drug use: No  . Sexual activity: Not on file  Other Topics Concern  . Not on file  Social History Narrative  . Not on file   Social Determinants of Health   Financial Resource Strain:   . Difficulty of Paying Living Expenses:   Food Insecurity:   . Worried About Charity fundraiser in the Last Year:   . Arboriculturist in the Last Year:   Transportation Needs:   . Film/video editor (Medical):   Marland Kitchen Lack of Transportation (Non-Medical):   Physical Activity:   . Days of Exercise per Week:   . Minutes of Exercise per Session:   Stress:   . Feeling of Stress :   Social  Connections:   . Frequency of Communication with Friends and Family:   . Frequency of Social Gatherings with Friends and Family:   . Attends Religious Services:   . Active Member of Clubs or Organizations:   . Attends Archivist Meetings:   Marland Kitchen Marital Status:    Family History  Problem Relation Age of Onset  . Hypertension Mother   . Alcoholism Father   . Migraines Father   . Stroke Maternal Grandmother   . Breast cancer Paternal Grandmother   . Alcoholism Paternal Grandfather    No Known Allergies Prior to Admission medications   Medication Sig Start Date End Date Taking? Authorizing Provider  benzonatate (TESSALON PERLES) 100 MG capsule Take 1 capsule (100 mg total) by mouth 3 (three) times daily as needed for cough. 09/22/16  Yes Servando Salina, MD  Biotin 1000 MCG tablet Take 1,000 mcg by mouth See admin instructions. Takes 3 times a month   Yes [provider]  clindamycin (CLEOCIN T) 1 % lotion Apply 1 application topically daily as needed. Acne 08/08/16  Yes [provider]  docusate sodium (COLACE) 100 MG capsule Take 100-200 mg by mouth every other day.   Yes [provider]  guaifenesin (ROBITUSSIN) 100 MG/5ML syrup Take 200 mg by mouth 3 (three) times  daily as needed for cough.   Yes [provider]  oxyCODONE-acetaminophen (PERCOCET/ROXICET) 5-325 MG tablet Take 1 tablet by mouth every 6 (six) hours as needed for moderate pain. for pain 09/09/16  Yes [provider]  promethazine (PHENERGAN) 25 MG tablet Take 1 tablet (25 mg total) by mouth every 6 (six) hours as needed for nausea or vomiting. 09/22/16  Yes Servando Salina, MD  traMADol (ULTRAM) 50 MG tablet Take 1 tablet (50 mg total) by mouth every 6 (six) hours as needed. Patient taking differently: Take 50 mg by mouth every 6 (six) hours as needed for moderate pain.  09/14/16  Yes Servando Salina, MD     Positive ROS: Otherwise negative  All other systems  have been reviewed and were otherwise negative with the exception of those mentioned in the HPI and as above.  Physical Exam: Constitutional: Alert, well-appearing, no acute distress Ears: External ears without lesions or tenderness. Ear canals are clear bilaterally.  TMs are clear bilaterally with good mobility pneumatic otoscopy.  No middle ear abnormality noted.  On hearing screening with a 512 1024 tuning fork she hears well in both ears with AC > BC bilaterally. Nasal: External nose without lesions. Septum with mild deviation and moderate rhinitis.. Clear nasal passages without signs of infection. Oral: Lips and gums without lesions. Tongue and palate mucosa without lesions. Posterior oropharynx clear. Neck: No palpable adenopathy or masses.  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  She has slight deviation when she opens and closes her mouth of the lower jaw. Respiratory: Breathing comfortably  Skin: No facial/neck lesions or rash noted.  Procedures  Assessment: Mild rhinitis. Suspect the ear fullness is probably more related to her TMJ abnormality as she has clear ear canal and TM evaluation clinically with the microscope.  Plan: Recommended regular use of the Flonase at night 2 sprays each nostril as this will provide better nasal congestion relief. Reassured her of normal ear canal and TM evaluation bilaterally I suspect the fullness or pressure in the ears is probably more related to TMJ dysfunction.  Radene Journey, MD

## 2019-07-17 ENCOUNTER — Other Ambulatory Visit: Payer: Self-pay | Admitting: Surgery

## 2019-07-17 DIAGNOSIS — K4090 Unilateral inguinal hernia, without obstruction or gangrene, not specified as recurrent: Secondary | ICD-10-CM

## 2019-08-01 ENCOUNTER — Other Ambulatory Visit: Payer: Self-pay

## 2019-08-01 ENCOUNTER — Ambulatory Visit
Admission: RE | Admit: 2019-08-01 | Discharge: 2019-08-01 | Disposition: A | Discharge status: Home / Self Care | Payer: 59 | Source: Ambulatory Visit | Attending: Surgery | Admitting: Surgery

## 2019-08-01 ENCOUNTER — Other Ambulatory Visit: Payer: Self-pay | Admitting: Surgery

## 2019-08-01 DIAGNOSIS — K4090 Unilateral inguinal hernia, without obstruction or gangrene, not specified as recurrent: Secondary | ICD-10-CM

## 2020-02-11 ENCOUNTER — Other Ambulatory Visit: Payer: Self-pay | Admitting: Endocrinology

## 2020-02-11 DIAGNOSIS — E041 Nontoxic single thyroid nodule: Secondary | ICD-10-CM

## 2020-02-18 ENCOUNTER — Other Ambulatory Visit: Payer: Self-pay | Admitting: Internal Medicine

## 2020-03-19 ENCOUNTER — Ambulatory Visit
Admission: RE | Admit: 2020-03-19 | Discharge: 2020-03-19 | Disposition: A | Discharge status: Home / Self Care | Payer: 59 | Source: Ambulatory Visit | Attending: Endocrinology | Admitting: Endocrinology

## 2020-03-19 DIAGNOSIS — E041 Nontoxic single thyroid nodule: Secondary | ICD-10-CM

## 2020-03-25 ENCOUNTER — Other Ambulatory Visit: Payer: Self-pay | Admitting: Internal Medicine

## 2020-03-25 DIAGNOSIS — E041 Nontoxic single thyroid nodule: Secondary | ICD-10-CM

## 2020-03-28 ENCOUNTER — Other Ambulatory Visit: Payer: BC Managed Care – PPO

## 2020-04-08 ENCOUNTER — Other Ambulatory Visit: Payer: Self-pay | Admitting: Internal Medicine

## 2020-04-08 DIAGNOSIS — E041 Nontoxic single thyroid nodule: Secondary | ICD-10-CM

## 2020-04-18 ENCOUNTER — Other Ambulatory Visit (HOSPITAL_COMMUNITY)
Admission: RE | Admit: 2020-04-18 | Discharge: 2020-04-18 | Disposition: A | Discharge status: Home / Self Care | Payer: BC Managed Care – PPO | Source: Ambulatory Visit | Attending: Radiology | Admitting: Radiology

## 2020-04-18 ENCOUNTER — Ambulatory Visit
Admission: RE | Admit: 2020-04-18 | Discharge: 2020-04-18 | Disposition: A | Discharge status: Home / Self Care | Payer: BC Managed Care – PPO | Source: Ambulatory Visit | Attending: Internal Medicine | Admitting: Internal Medicine

## 2020-04-18 DIAGNOSIS — E041 Nontoxic single thyroid nodule: Secondary | ICD-10-CM

## 2020-04-22 LAB — CYTOLOGY - NON PAP

## 2020-05-06 ENCOUNTER — Encounter (HOSPITAL_COMMUNITY): Payer: Self-pay

## 2020-09-09 ENCOUNTER — Ambulatory Visit: Payer: BC Managed Care – PPO | Attending: Critical Care Medicine

## 2020-09-09 DIAGNOSIS — Z20822 Contact with and (suspected) exposure to covid-19: Secondary | ICD-10-CM

## 2020-09-10 LAB — NOVEL CORONAVIRUS, NAA: SARS-CoV-2, NAA: DETECTED — AB

## 2020-09-10 LAB — SARS-COV-2, NAA 2 DAY TAT

## 2020-12-04 IMAGING — US US PELVIS COMPLETE WITH TRANSVAGINAL
1 series · 13 of 25 positions shown · non-contrast
Comparison: None

CLINICAL DATA: 48-year-old female with right pelvic pain. History
of endometriosis and hysterectomy.



[Series 1: us pelvis complete with transvaginal · 0.26mm/px · 36 acquisitions, 13 frames shown]
[im 1/36]
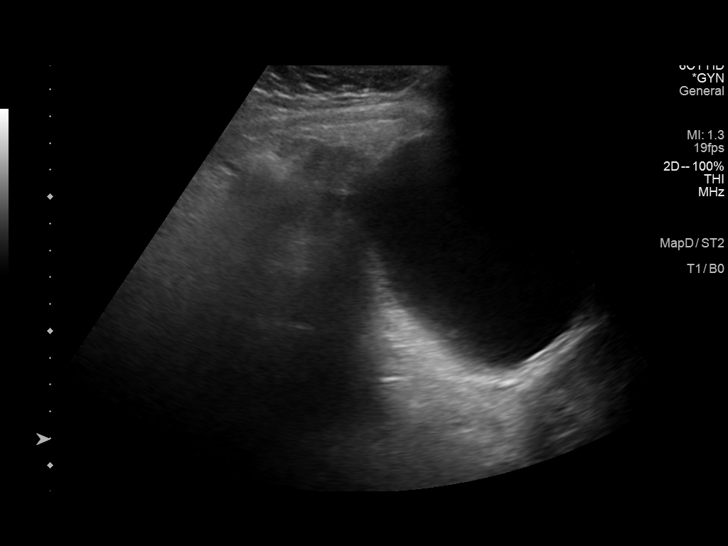
[im 3/36]
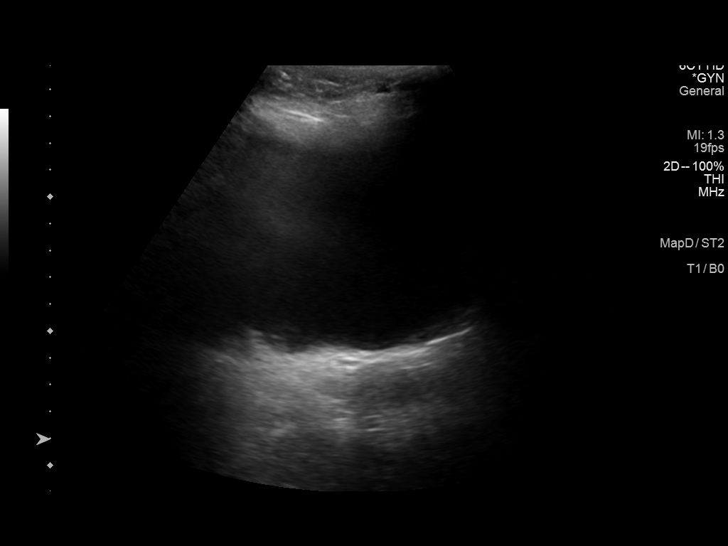
[im 6/36]
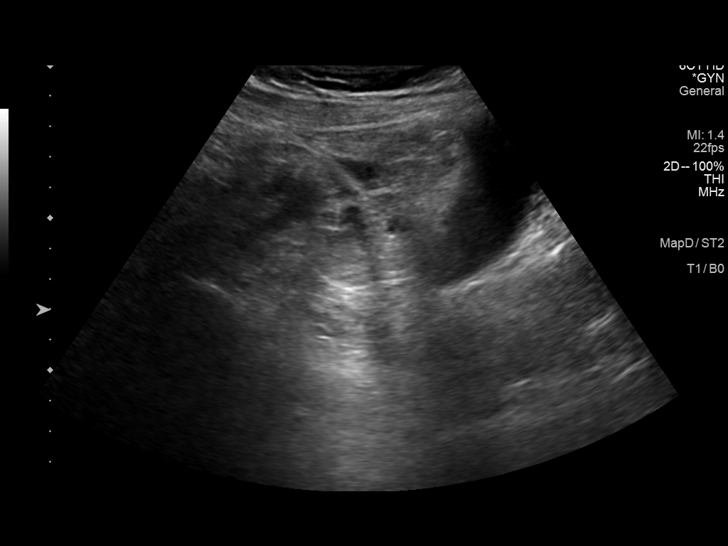
[im 9/36]
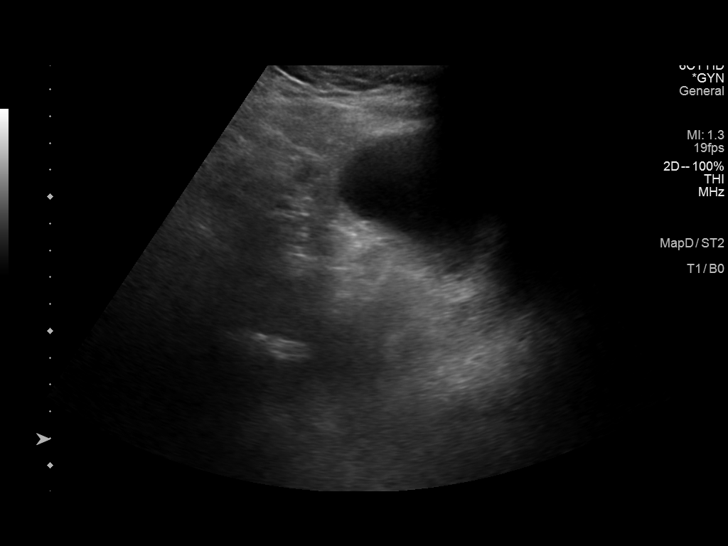
[im 12/36]
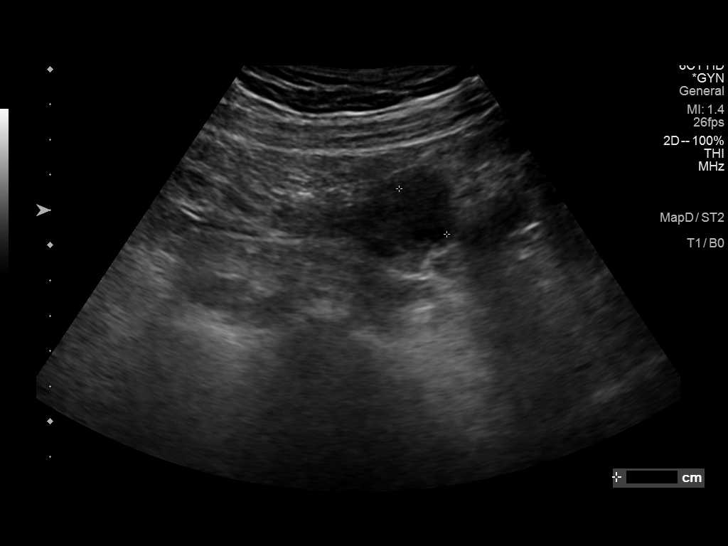
[im 15/36]
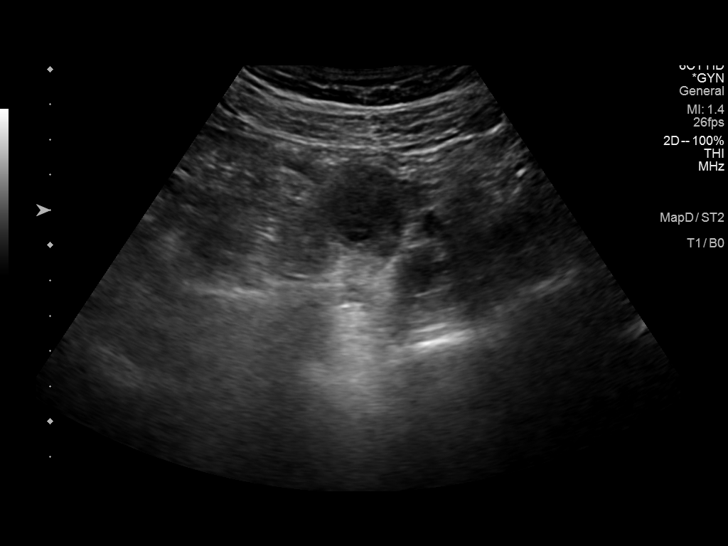
[im 18/36]
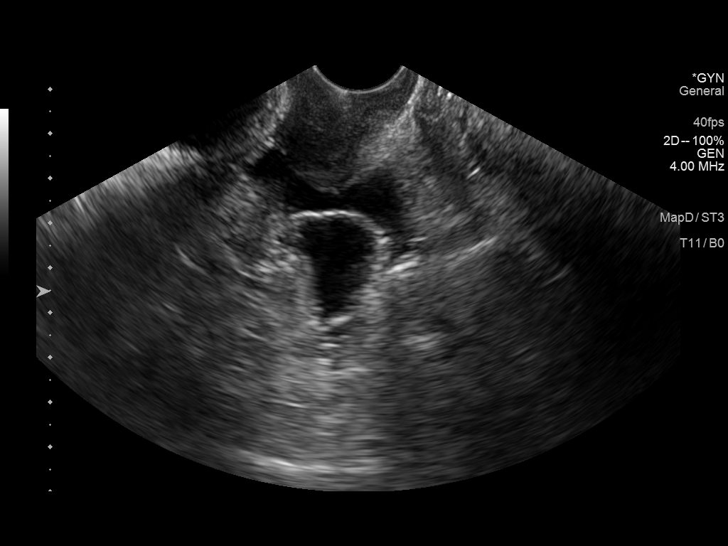
[im 21/36]
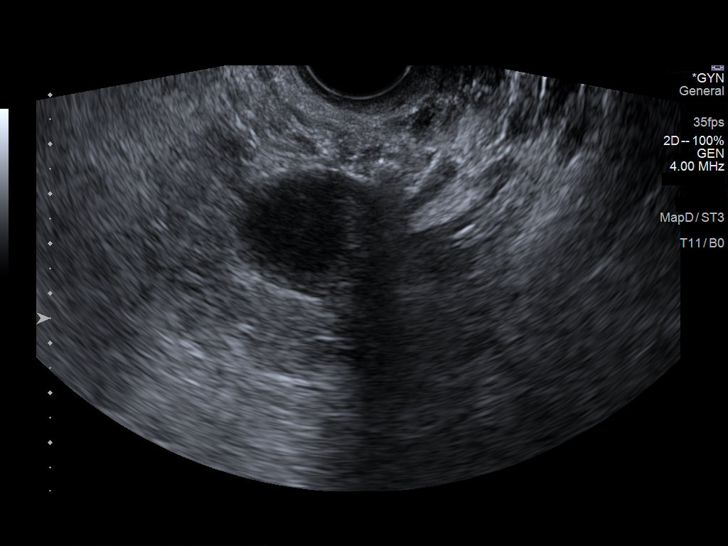
[im 24/36]
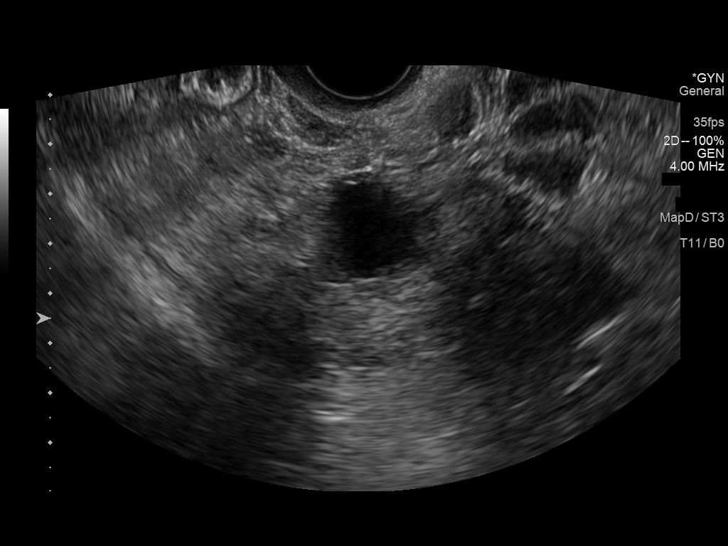
[im 27/36]
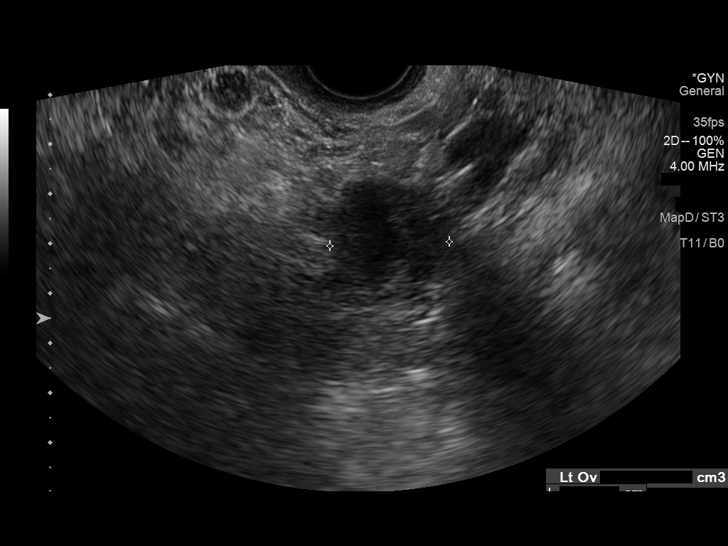
[im 30/36]
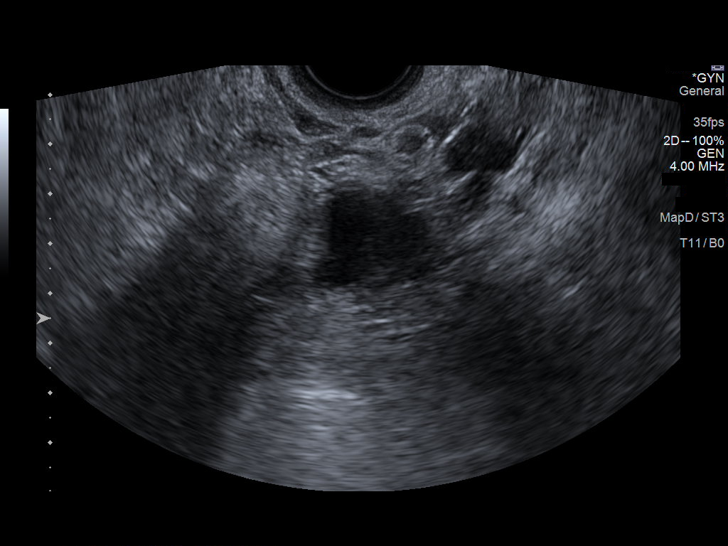
[im 33/36]
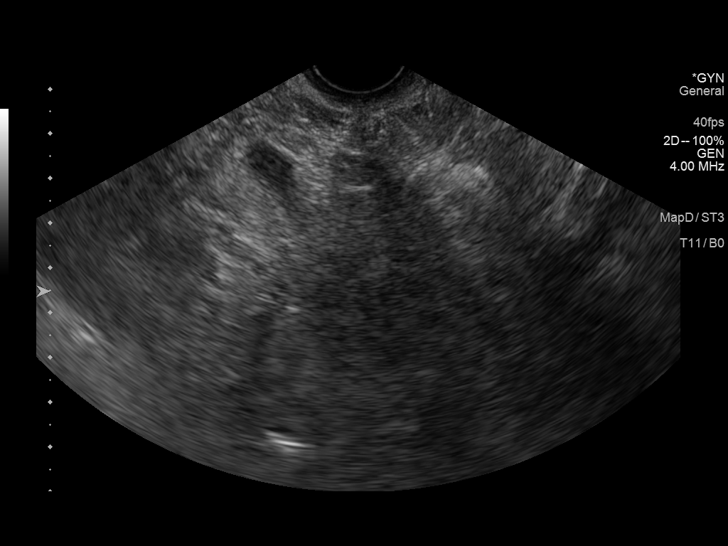
[im 36/36]
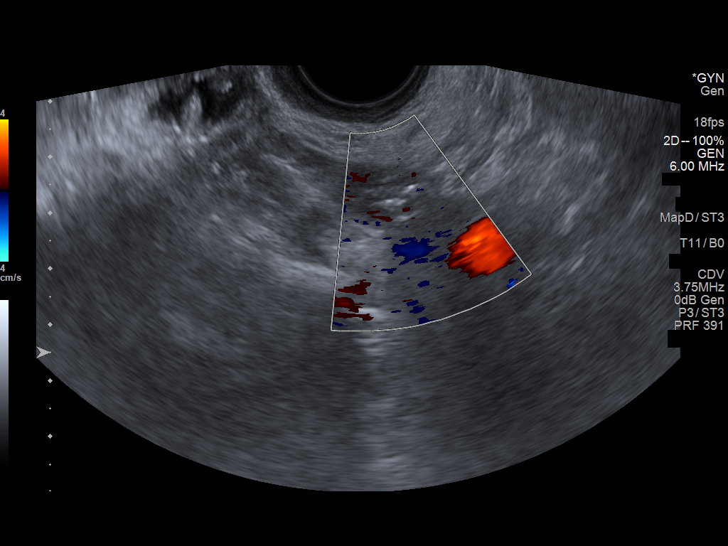

[13 of 25 positions shown; findings below may reference images not displayed]

FINDINGS: Evaluation is limited due to overlying bowel gas.

Uterus

Hysterectomy.

Endometrium

Hysterectomy.

Right ovary

Not visualized.

Left ovary

Measurements: 3.2 x 2.7 x 2.4 cm = volume: 11 mL. There is a 1.8 x
2.0 x 1.5 cm rounded hypoechoic structure in the region of the left
ovary without significant internal vascularity. This likely
represents a minimally complex cyst. An endometrioma is less likely.
Follow-up with ultrasound in 2 months recommended.

Other findings

No abnormal free fluid.
IMPRESSION: 1. Prior hysterectomy.
2. Hypoechoic structure in the region of the left ovary, possibly a
minimally complex cyst.

## 2020-12-18 ENCOUNTER — Encounter: Payer: Self-pay | Admitting: Sports Medicine

## 2020-12-18 ENCOUNTER — Ambulatory Visit (INDEPENDENT_AMBULATORY_CARE_PROVIDER_SITE_OTHER): Payer: BC Managed Care – PPO | Admitting: Sports Medicine

## 2020-12-18 ENCOUNTER — Other Ambulatory Visit: Payer: Self-pay | Admitting: Sports Medicine

## 2020-12-18 ENCOUNTER — Ambulatory Visit (INDEPENDENT_AMBULATORY_CARE_PROVIDER_SITE_OTHER): Payer: BC Managed Care – PPO

## 2020-12-18 ENCOUNTER — Other Ambulatory Visit: Payer: Self-pay

## 2020-12-18 DIAGNOSIS — J301 Allergic rhinitis due to pollen: Secondary | ICD-10-CM | POA: Insufficient documentation

## 2020-12-18 DIAGNOSIS — M79672 Pain in left foot: Secondary | ICD-10-CM

## 2020-12-18 DIAGNOSIS — E041 Nontoxic single thyroid nodule: Secondary | ICD-10-CM | POA: Insufficient documentation

## 2020-12-18 DIAGNOSIS — M779 Enthesopathy, unspecified: Secondary | ICD-10-CM

## 2020-12-18 DIAGNOSIS — M1990 Unspecified osteoarthritis, unspecified site: Secondary | ICD-10-CM | POA: Insufficient documentation

## 2020-12-18 DIAGNOSIS — I10 Essential (primary) hypertension: Secondary | ICD-10-CM

## 2020-12-18 DIAGNOSIS — R899 Unspecified abnormal finding in specimens from other organs, systems and tissues: Secondary | ICD-10-CM | POA: Insufficient documentation

## 2020-12-18 DIAGNOSIS — M7752 Other enthesopathy of left foot: Secondary | ICD-10-CM

## 2020-12-18 DIAGNOSIS — K5900 Constipation, unspecified: Secondary | ICD-10-CM | POA: Insufficient documentation

## 2020-12-18 DIAGNOSIS — M7750 Other enthesopathy of unspecified foot: Secondary | ICD-10-CM

## 2020-12-18 DIAGNOSIS — D1803 Hemangioma of intra-abdominal structures: Secondary | ICD-10-CM | POA: Insufficient documentation

## 2020-12-18 DIAGNOSIS — I872 Venous insufficiency (chronic) (peripheral): Secondary | ICD-10-CM | POA: Insufficient documentation

## 2020-12-18 DIAGNOSIS — E042 Nontoxic multinodular goiter: Secondary | ICD-10-CM | POA: Insufficient documentation

## 2020-12-18 DIAGNOSIS — M722 Plantar fascial fibromatosis: Secondary | ICD-10-CM

## 2020-12-18 DIAGNOSIS — R600 Localized edema: Secondary | ICD-10-CM | POA: Insufficient documentation

## 2020-12-18 DIAGNOSIS — Z0000 Encounter for general adult medical examination without abnormal findings: Secondary | ICD-10-CM | POA: Insufficient documentation

## 2020-12-18 HISTORY — DX: Essential (primary) hypertension: I10

## 2020-12-18 MED ORDER — MELOXICAM 15 MG PO TABS: 15.0000 mg | ORAL_TABLET | Freq: Every day | ORAL | 0 refills | Status: DC

## 2020-12-18 MED ORDER — PREDNISONE 10 MG (21) PO TBPK: ORAL_TABLET | ORAL | 0 refills | Status: DC

## 2020-12-18 NOTE — Progress Notes (Signed)
Subjective: Robin Blackwell is a 50 y.o. female patient who presents to office for evaluation of left foot pain. Patient complains of progressive pain especially over the last several months states that pain sometimes gets so bad that she limps pain is worse with walking it comes and goes states that pain is in the arch had a few episodes of pain in the heel but most pain and swelling is over the top of the foot.  Patient reports that she is a Pharmacist, hospital at [employer] and when she is on her feet a lot she tends to have pain as well as she has a business at the [employer] that keeps her busy even on the weekend and has noticed the more that she does standing and walking the more she has pain.  Patient denies any other pedal complaints. Denies known injury/trip/fall/sprain/any other causative factors.   Admits to a family history of rheumatoid arthritis.  States that she has been checked twice in the past when she was younger for arthritis but every time it has been negative.  Patient Active Problem List   Diagnosis Date Noted   Abnormal thyroid biopsy 12/18/2020   Allergic rhinitis due to pollen 12/18/2020   Arthritis 12/18/2020   Constipation 12/18/2020   Encounter for general adult medical examination without abnormal findings 12/18/2020   Essential hypertension 12/18/2020   Hepatic hemangioma 12/18/2020   Multinodular goiter 12/18/2020   Pedal edema 12/18/2020   Peripheral venous insufficiency 12/18/2020   Thyroid nodule 12/18/2020   Cough, persistent 10/11/2018   Gastroesophageal reflux disease without esophagitis 10/11/2018   Globus pharyngeus 10/11/2018   Rhinitis, chronic 10/11/2018   S/P total hysterectomy 09/13/2016   History of postpartum hypertension 01/27/2016   Tension type headache 11/30/2013   Migraine without aura, without mention of intractable migraine without mention of status migrainosus 09/25/2013   Sprain of neck 09/25/2013   Hx of endometriosis 09/06/2012   Seasonal  allergies 08/25/2012    Current Outpatient Medications on File Prior to Visit  Medication Sig Dispense Refill   albuterol (VENTOLIN HFA) 108 (90 Base) MCG/ACT inhaler INHALE 1 PUFF BY MOUTH EVERY 4 HOURS AS NEEDED     amLODipine (NORVASC) 5 MG tablet Take 5 mg by mouth daily.     amLODipine-atorvastatin (CADUET) 5-10 MG tablet      aspirin-acetaminophen-caffeine (EXCEDRIN MIGRAINE) 250-250-65 MG tablet Take by mouth.     Azelastine HCl 137 MCG/SPRAY SOLN Place 1 spray into both nostrils 2 (two) times daily.     Biotin 1000 MCG tablet Take 1,000 mcg by mouth See admin instructions. Takes 3 times a month     brompheniramine-pseudoephedrine-DM 30-2-10 MG/5ML syrup Take 5 mLs by mouth 3 (three) times daily.     cetirizine (ZYRTEC) 10 MG tablet Take by mouth.     [employer] NASAL SPRAY 0.05 % nasal spray Place 1 spray into both nostrils 2 (two) times daily.     fluticasone (FLONASE) 50 MCG/ACT nasal spray 1 spray in each nostril     ibuprofen (ADVIL) 400 MG tablet Take 400 mg by mouth 3 (three) times daily as needed.     montelukast (SINGULAIR) 10 MG tablet      Promethazine-Phenyleph-Codeine (PROMETHAZINE VC/CODEINE) 6.25-5-10 MG/5ML SYRP Take 5 mLs by mouth 3 (three) times daily as needed.     docusate sodium (COLACE) 100 MG capsule Take 100-200 mg by mouth every other day.     No current facility-administered medications on file prior to visit.    No  Known Allergies  Objective:  General: Alert and oriented x3 in no acute distress  Dermatology: No open lesions bilateral lower extremities, no webspace macerations, no ecchymosis bilateral, all nails x 10 are well manicured.  Vascular: Dorsalis Pedis and Posterior Tibial pedal pulses palpable, Capillary Fill Time 3 seconds,(+) pedal hair growth bilateral, no edema bilateral lower extremities, Temperature gradient within normal limits.  Neurology: Johney Maine sensation intact via light touch bilateral.  Musculoskeletal: Mild tenderness with palpation  at medial arch left greater than right heel at plantar fascial insertion and dorsal midfoot.  Pain at the midfoot is worse on the left.  Strength appears to be within normal limits however there is mild guarding due to pain on left.  Pes planus foot type.  Gait: Antalgic gait  Xrays  Left foot   Impression: Midtarsal breech supportive of pes planus foot type no significant osteophytes or signs of degenerative arthritis.  Assessment and Plan: Problem List Items Addressed This Visit   None Visit Diagnoses     Left foot pain    -  Primary   Capsulitis       Plantar fasciitis of left foot       Tendinitis            -Complete examination performed -Xrays reviewed -Discussed treatment options for possible overuse capsulitis tendinitis fasciitis versus underlying arthritic flare however this is less likely due to the acute nature of symptoms -Rx prednisone Dosepak to take first as instructed followed by meloxicam -Dispensed cam boot to help offload left foot to wear at least 2 weeks and then slowly may try to transition back to her normal shoe -Advised rest ice elevation soaking and topical pain creams or rubs as needed -Patient to return to office in 3 weeks or sooner if condition worsens.  Landis Martins, DPM

## 2021-01-08 ENCOUNTER — Ambulatory Visit (INDEPENDENT_AMBULATORY_CARE_PROVIDER_SITE_OTHER): Payer: BC Managed Care – PPO | Admitting: Sports Medicine

## 2021-01-08 ENCOUNTER — Encounter: Payer: Self-pay | Admitting: Sports Medicine

## 2021-01-08 ENCOUNTER — Other Ambulatory Visit: Payer: Self-pay

## 2021-01-08 DIAGNOSIS — M069 Rheumatoid arthritis, unspecified: Secondary | ICD-10-CM

## 2021-01-08 DIAGNOSIS — M722 Plantar fascial fibromatosis: Secondary | ICD-10-CM

## 2021-01-08 DIAGNOSIS — M779 Enthesopathy, unspecified: Secondary | ICD-10-CM

## 2021-01-08 DIAGNOSIS — M79672 Pain in left foot: Secondary | ICD-10-CM

## 2021-01-08 MED ORDER — TRIAMCINOLONE ACETONIDE 10 MG/ML IJ SUSP
10.0000 mg | Freq: Once | INTRAMUSCULAR | Status: AC
  Administered 2021-01-08: 10 mg

## 2021-01-08 NOTE — Progress Notes (Signed)
Subjective: Robin Blackwell is a 50 y.o. female returns to office for follow up evaluation left foot and heel pain.  Patient reports that pain seems like it is worse comes and goes sharp pain at the plantar heel and arch states that she also gets some swelling pain is less when she is wearing the boot especially across the top of the foot and into the toes but still has pain in the heel.  Reports that pain is 7 out of 10.  Patient Active Problem List   Diagnosis Date Noted   Abnormal thyroid biopsy 12/18/2020   Allergic rhinitis due to pollen 12/18/2020   Arthritis 12/18/2020   Constipation 12/18/2020   Encounter for general adult medical examination without abnormal findings 12/18/2020   Essential hypertension 12/18/2020   Hepatic hemangioma 12/18/2020   Multinodular goiter 12/18/2020   Pedal edema 12/18/2020   Peripheral venous insufficiency 12/18/2020   Thyroid nodule 12/18/2020   Cough, persistent 10/11/2018   Gastroesophageal reflux disease without esophagitis 10/11/2018   Globus pharyngeus 10/11/2018   Rhinitis, chronic 10/11/2018   S/P total hysterectomy 09/13/2016   History of postpartum hypertension 01/27/2016   Tension type headache 11/30/2013   Migraine without aura, without mention of intractable migraine without mention of status migrainosus 09/25/2013   Sprain of neck 09/25/2013   Hx of endometriosis 09/06/2012   Seasonal allergies 08/25/2012    Current Outpatient Medications on File Prior to Visit  Medication Sig Dispense Refill   albuterol (VENTOLIN HFA) 108 (90 Base) MCG/ACT inhaler INHALE 1 PUFF BY MOUTH EVERY 4 HOURS AS NEEDED     amLODipine (NORVASC) 5 MG tablet Take 5 mg by mouth daily.     amLODipine-atorvastatin (CADUET) 5-10 MG tablet      aspirin-acetaminophen-caffeine (EXCEDRIN MIGRAINE) 250-250-65 MG tablet Take by mouth.     Azelastine HCl 137 MCG/SPRAY SOLN Place 1 spray into both nostrils 2 (two) times daily.     Biotin 1000 MCG tablet Take 1,000 mcg by  mouth See admin instructions. Takes 3 times a month     brompheniramine-pseudoephedrine-DM 30-2-10 MG/5ML syrup Take 5 mLs by mouth 3 (three) times daily.     cetirizine (ZYRTEC) 10 MG tablet Take by mouth.     CVS NASAL SPRAY 0.05 % nasal spray Place 1 spray into both nostrils 2 (two) times daily.     docusate sodium (COLACE) 100 MG capsule Take 100-200 mg by mouth every other day.     fluticasone (FLONASE) 50 MCG/ACT nasal spray 1 spray in each nostril     ibuprofen (ADVIL) 400 MG tablet Take 400 mg by mouth 3 (three) times daily as needed.     meloxicam (MOBIC) 15 MG tablet Take 1 tablet (15 mg total) by mouth daily. 30 tablet 0   montelukast (SINGULAIR) 10 MG tablet      predniSONE (STERAPRED UNI-PAK 21 TAB) 10 MG (21) TBPK tablet Take as directed 21 tablet 0   Promethazine-Phenyleph-Codeine (PROMETHAZINE VC/CODEINE) 6.25-5-10 MG/5ML SYRP Take 5 mLs by mouth 3 (three) times daily as needed.     No current facility-administered medications on file prior to visit.    No Known Allergies  Objective:   General:  Alert and oriented x 3, in no acute distress  Dermatology: Skin is warm, dry, and supple bilateral. Nails are within normal limits. There is no lower extremity erythema, no eccymosis, no open lesions present bilateral.   Vascular: Dorsalis Pedis and Posterior Tibial pedal pulses are 2/4 bilateral. + hair growth noted bilateral. Capillary  Fill Time is 3 seconds in all digits. No varicosities, No edema bilateral lower extremities.   Neurological: Sensation grossly intact to light touch bilateral.  Musculoskeletal: There is tenderness to palpation at the medial calcaneal tubercale and through the insertion of the plantar fascia on the left foot that extends slightly into the arch.  Decreased pain to the midtarsal joint ball or lesser MPJs however there is some mild pain that radiates over the first MPJ at the medial aspect on the left, range of motion guarded, strength 5/5 bilateral.    Assessment and Plan: Problem List Items Addressed This Visit   None Visit Diagnoses     Plantar fasciitis of left foot    -  Primary   Rheumatoid arthritis of left foot, unspecified whether rheumatoid factor present (HCC)       Relevant Orders   Uric acid   Sedimentation rate   C-reactive protein   Rheumatoid factor   ANA, IFA Comprehensive Panel   HLA-B27 antigen   CBC with Differential/Platelet   Left foot pain       Arch pain, left       Tendinitis           -Complete examination performed.  -Previous x-rays reviewed. -Discussed with patient in detail the condition of plantar fasciitis, how this  occurs related to the foot type of the patient and general treatment options. - Patient opted for another injection today; After oral consent and aseptic prep, injected a mixture containing 1 ml of 1%plain lidocaine, 1 ml 0.5% plain marcaine, 0.5 ml of kenalog 10 and 0.5 ml of dexmethasone phosphate to left heel at area of most pain/trigger point injection. -Advised patient to continue with cam boot for 1 more week after 1 week may transition to a normal shoe as long as pain is improved -Continue with gentle stretching and icing as directed -Ordered arthritic panel for further evaluation of any other additional underlying inflammatory cause for her pain since patient has a strong history of rheumatoid arthritis -Discussed long term care and reocurrence; will closely monitor; if fails to improve will consider other treatment modalities.  -Patient to return to office after blood work or sooner if problems or questions arise.  Advised patient that she may send me a message via MyChart if there is any increase in pain or symptoms meanwhile.  Landis Martins, DPM

## 2021-01-12 ENCOUNTER — Encounter: Payer: Self-pay | Admitting: Sports Medicine

## 2021-01-12 ENCOUNTER — Other Ambulatory Visit: Payer: Self-pay | Admitting: Sports Medicine

## 2021-01-12 DIAGNOSIS — M069 Rheumatoid arthritis, unspecified: Secondary | ICD-10-CM

## 2021-01-12 LAB — HLA-B27 ANTIGEN: HLA-B27 Antigen: NEGATIVE

## 2021-01-12 LAB — CBC WITH DIFFERENTIAL/PLATELET
Absolute Monocytes: 360 cells/uL (ref 200–950)
Basophils Absolute: 41 cells/uL (ref 0–200)
Basophils Relative: 0.7 %
Eosinophils Absolute: 278 cells/uL (ref 15–500)
Eosinophils Relative: 4.8 %
HCT: 38.2 % (ref 35.0–45.0)
Hemoglobin: 12.7 g/dL (ref 11.7–15.5)
Lymphs Abs: 1253 cells/uL (ref 850–3900)
MCH: 30.3 pg (ref 27.0–33.0)
MCHC: 33.2 g/dL (ref 32.0–36.0)
MCV: 91.2 fL (ref 80.0–100.0)
MPV: 11.1 fL (ref 7.5–12.5)
Monocytes Relative: 6.2 %
Neutro Abs: 3869 cells/uL (ref 1500–7800)
Neutrophils Relative %: 66.7 %
Platelets: 247 10*3/uL (ref 140–400)
RBC: 4.19 10*6/uL (ref 3.80–5.10)
RDW: 12.8 % (ref 11.0–15.0)
Total Lymphocyte: 21.6 %
WBC: 5.8 10*3/uL (ref 3.8–10.8)

## 2021-01-12 LAB — ANA, IFA COMPREHENSIVE PANEL
Anti Nuclear Antibody (ANA): POSITIVE — AB
ENA SM Ab Ser-aCnc: <1 AI
SM/RNP: <1 AI
SSA (Ro) (ENA) Antibody, IgG: <1 AI
SSB (La) (ENA) Antibody, IgG: <1 AI
Scleroderma (Scl-70) (ENA) Antibody, IgG: <1 AI
ds DNA Ab: <1 IU/mL

## 2021-01-12 LAB — RHEUMATOID FACTOR: Rheumatoid fact SerPl-aCnc: <14 [IU]/mL (ref <14)

## 2021-01-12 LAB — ANTI-NUCLEAR AB-TITER (ANA TITER): ANA Titer 1: 1:80 {titer} — ABNORMAL HIGH

## 2021-01-12 LAB — C-REACTIVE PROTEIN: CRP: 3 mg/L

## 2021-01-12 LAB — SEDIMENTATION RATE: Sed Rate: 9 mm/h (ref 0–20)

## 2021-01-12 LAB — URIC ACID: Uric Acid, Serum: 3.7 mg/dL (ref 2.5–7.0)

## 2021-01-12 NOTE — Progress Notes (Signed)
Referral placed to [facility] Rheumatology + ANA on arthritic panel -Dr. Chauncey Cruel

## 2021-01-14 ENCOUNTER — Other Ambulatory Visit: Payer: Self-pay | Admitting: Sports Medicine

## 2021-02-24 ENCOUNTER — Ambulatory Visit: Payer: BC Managed Care – PPO | Admitting: Internal Medicine

## 2021-03-23 ENCOUNTER — Ambulatory Visit: Payer: BC Managed Care – PPO | Admitting: Internal Medicine

## 2021-04-23 ENCOUNTER — Other Ambulatory Visit: Payer: Self-pay | Admitting: Internal Medicine

## 2021-04-23 DIAGNOSIS — E042 Nontoxic multinodular goiter: Secondary | ICD-10-CM

## 2021-05-07 ENCOUNTER — Telehealth: Payer: Self-pay | Admitting: *Deleted

## 2021-05-07 ENCOUNTER — Other Ambulatory Visit: Payer: Self-pay | Admitting: Sports Medicine

## 2021-05-07 ENCOUNTER — Other Ambulatory Visit: Payer: BC Managed Care – PPO

## 2021-05-07 DIAGNOSIS — M069 Rheumatoid arthritis, unspecified: Secondary | ICD-10-CM

## 2021-05-07 NOTE — Telephone Encounter (Signed)
Patient is calling to ask Dr Cannon Kettle if she can send another referral to a Rheumatologist, she saw a Rheumatologist in her pcp's office but was not satisfied with the visit.Please advise.

## 2021-05-07 NOTE — Telephone Encounter (Signed)
Patient notified

## 2021-05-07 NOTE — Progress Notes (Signed)
Referral placed to [facility] rheumatology.

## 2021-05-08 ENCOUNTER — Ambulatory Visit
Admission: RE | Admit: 2021-05-08 | Discharge: 2021-05-08 | Disposition: A | Discharge status: Home / Self Care | Payer: BC Managed Care – PPO | Source: Ambulatory Visit | Attending: Internal Medicine | Admitting: Internal Medicine

## 2021-05-08 DIAGNOSIS — E042 Nontoxic multinodular goiter: Secondary | ICD-10-CM

## 2021-05-15 ENCOUNTER — Telehealth: Payer: Self-pay | Admitting: *Deleted

## 2021-05-15 NOTE — Telephone Encounter (Signed)
Faxed labs to [facility] Rheumatology, received confirmation 05/15/21. ?

## 2021-05-15 NOTE — Telephone Encounter (Signed)
Faxed the referral along with office notes to [facility] Rheumatology on 05/11/21,confirmation received. ?

## 2021-05-21 ENCOUNTER — Other Ambulatory Visit: Payer: Self-pay

## 2021-05-21 ENCOUNTER — Ambulatory Visit (INDEPENDENT_AMBULATORY_CARE_PROVIDER_SITE_OTHER): Payer: BC Managed Care – PPO | Admitting: Sports Medicine

## 2021-05-21 DIAGNOSIS — M779 Enthesopathy, unspecified: Secondary | ICD-10-CM

## 2021-05-21 DIAGNOSIS — M722 Plantar fascial fibromatosis: Secondary | ICD-10-CM | POA: Diagnosis not present

## 2021-05-21 DIAGNOSIS — M79672 Pain in left foot: Secondary | ICD-10-CM

## 2021-05-21 DIAGNOSIS — M069 Rheumatoid arthritis, unspecified: Secondary | ICD-10-CM | POA: Diagnosis not present

## 2021-05-21 DIAGNOSIS — M21862 Other specified acquired deformities of left lower leg: Secondary | ICD-10-CM

## 2021-05-21 NOTE — Progress Notes (Signed)
Subjective: ?Robin Blackwell is a 51 y.o. female returns to office for follow up evaluation left foot and heel pain.  Patient reports that previous injection seems to help pain is less but it still 6 out of 10 when she does a lot of walking and standing and states that she followed up with the rheumatologist that her primary doctor recommended and at first they said she had rheumatoid arthritis but then they said maybe she did not.  Patient reports that she is still hurting and also is having increased knee pain had an injection of steroid and has been using brace on the right knee.  Patient denies any other pedal complaints at this time. ? ?Patient Active Problem List  ? Diagnosis Date Noted  ? Abnormal thyroid biopsy 12/18/2020  ? Allergic rhinitis due to pollen 12/18/2020  ? Arthritis 12/18/2020  ? Constipation 12/18/2020  ? Encounter for general adult medical examination without abnormal findings 12/18/2020  ? Essential hypertension 12/18/2020  ? Hepatic hemangioma 12/18/2020  ? Multinodular goiter 12/18/2020  ? Pedal edema 12/18/2020  ? Peripheral venous insufficiency 12/18/2020  ? Thyroid nodule 12/18/2020  ? Cough, persistent 10/11/2018  ? Gastroesophageal reflux disease without esophagitis 10/11/2018  ? Globus pharyngeus 10/11/2018  ? Rhinitis, chronic 10/11/2018  ? S/P total hysterectomy 09/13/2016  ? History of postpartum hypertension 01/27/2016  ? Tension type headache 11/30/2013  ? Migraine without aura, without mention of intractable migraine without mention of status migrainosus 09/25/2013  ? Sprain of neck 09/25/2013  ? Hx of endometriosis 09/06/2012  ? Seasonal allergies 08/25/2012  ? ? ?Current Outpatient Medications on File Prior to Visit  ?Medication Sig Dispense Refill  ? Sod Picosulfate-Mag Ox-Cit Acd (CLENPIQ) 10-3.5-12 MG-GM -GM/160ML SOLN Clenpiq 10 mg-3.5 gram-12 gram/160 mL oral solution ? TAKE 160 ML ORALLY TWICE    ? albuterol (VENTOLIN HFA) 108 (90 Base) MCG/ACT inhaler INHALE 1 PUFF BY  MOUTH EVERY 4 HOURS AS NEEDED    ? amLODipine (NORVASC) 5 MG tablet Take 5 mg by mouth daily.    ? amLODipine-atorvastatin (CADUET) 5-10 MG tablet     ? aspirin-acetaminophen-caffeine (EXCEDRIN MIGRAINE) 250-250-65 MG tablet Take by mouth.    ? Azelastine HCl 137 MCG/SPRAY SOLN Place 1 spray into both nostrils 2 (two) times daily.    ? Biotin 1000 MCG tablet Take 1,000 mcg by mouth See admin instructions. Takes 3 times a month    ? brompheniramine-pseudoephedrine-DM 30-2-10 MG/5ML syrup Take 5 mLs by mouth 3 (three) times daily.    ? cetirizine (ZYRTEC) 10 MG tablet Take by mouth.    ? CLENPIQ 10-3.5-12 MG-GM -GM/160ML SOLN SMARTSIG:160 Milliliter(s) By Mouth Twice Daily    ? CVS NASAL SPRAY 0.05 % nasal spray Place 1 spray into both nostrils 2 (two) times daily.    ? docusate sodium (COLACE) 100 MG capsule Take 100-200 mg by mouth every other day.    ? fluticasone (FLONASE) 50 MCG/ACT nasal spray 1 spray in each nostril    ? ibuprofen (ADVIL) 400 MG tablet Take 400 mg by mouth 3 (three) times daily as needed.    ? meloxicam (MOBIC) 15 MG tablet TAKE 1 TABLET (15 MG TOTAL) BY MOUTH DAILY. 30 tablet 0  ? montelukast (SINGULAIR) 10 MG tablet     ? predniSONE (STERAPRED UNI-PAK 21 TAB) 10 MG (21) TBPK tablet Take as directed 21 tablet 0  ? Promethazine-Phenyleph-Codeine (PROMETHAZINE VC/CODEINE) 6.25-5-10 MG/5ML SYRP Take 5 mLs by mouth 3 (three) times daily as needed.    ? ?  No current facility-administered medications on file prior to visit.  ? ? ?No Known Allergies ? ?Objective:  ? ?General:  Alert and oriented x 3, in no acute distress ? ?Dermatology: Skin is warm, dry, and supple bilateral. Nails are within normal limits. There is no lower extremity erythema, no eccymosis, no open lesions present bilateral.  ? ?Vascular: Dorsalis Pedis and Posterior Tibial pedal pulses are 2/4 bilateral. + hair growth noted bilateral. Capillary Fill Time is 3 seconds in all digits. No varicosities, No edema bilateral lower  extremities.  ? ?Neurological: Sensation grossly intact to light touch bilateral. ? ?Musculoskeletal: There is tenderness to palpation at the medial calcaneal tubercale and through the insertion of the plantar fascia on the left foot, no pain at this time that extends into the arch, there is some diffuse pain dorsally along the extensor tendon complex on the left.  Limited ankle joint range of motion on the left.  Range of motion guarded, strength 5/5 bilateral.  ? ?Assessment and Plan: ?Problem List Items Addressed This Visit   ?None ?Visit Diagnoses   ? ? Plantar fasciitis of left foot    -  Primary  ? Left foot pain      ? Tendinitis      ? Rheumatoid arthritis of left foot, unspecified whether rheumatoid factor present ([place])      ? Gastrocnemius equinus, left      ? ?  ? ? ? ? ?-Complete examination performed.  ?-Discussed with patient continued care for heel pain and tendinitis secondary to equinus ?-Patient declined repeat injection at this time states that she is doing better and wants to hold off on any other injection ?-Advised consistent stretching and if she is not making progress we will need formal physical therapy ?-Dispensed night splint to assist with stretching the posterior and anterior musculature group to prevent over pulling of the Achilles and plantar fascia for patient to use as directed ?-Advised icing daily for 20 minutes in the evening with use of night splint ?-Advised patient because of her underlying inflammatory conditions including several strong family and possible personal history of rheumatoid as well as previous diagnosis of long-haul COVID may be affecting her ability to respond to inflammation ?-Discussed long term care and reocurrence; will closely monitor; if fails to improve will consider other treatment modalities.  ?-Patient to return to office if symptoms fail to continue to improve or to send a message via MyChart or call the office mid-May if she wants to start physical  therapy. ? ?Landis Martins, DPM ? ? ?

## 2021-06-10 ENCOUNTER — Encounter: Payer: Self-pay | Admitting: Sports Medicine

## 2021-09-02 IMAGING — US US THYROID
1 series · 13 of 25 positions shown · non-contrast
Comparison: None.

CLINICAL DATA: Palpable abnormality. Palpable thyroid nodule

EXAM:
THYROID ULTRASOUND
TECHNIQUE: Ultrasound examination of the thyroid gland and adjacent soft
tissues was performed.

[Series 1: us thyroid · 0.06mm/px · 13 of 39 slices shown]
[im 1/39]
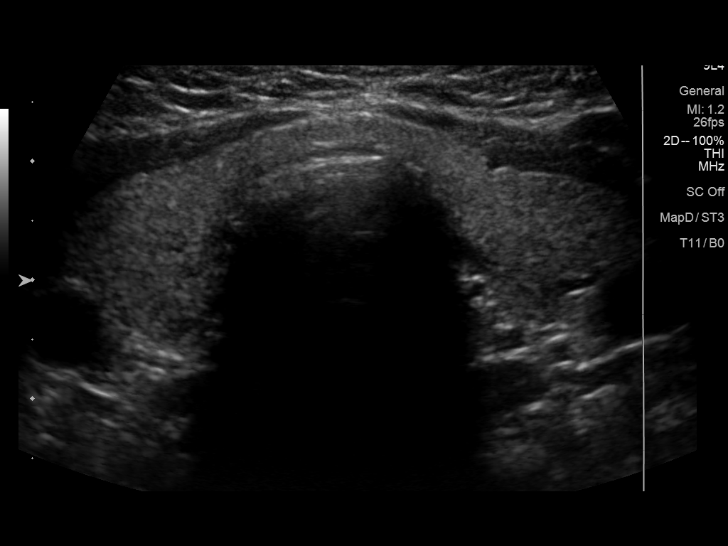
[im 4/39]
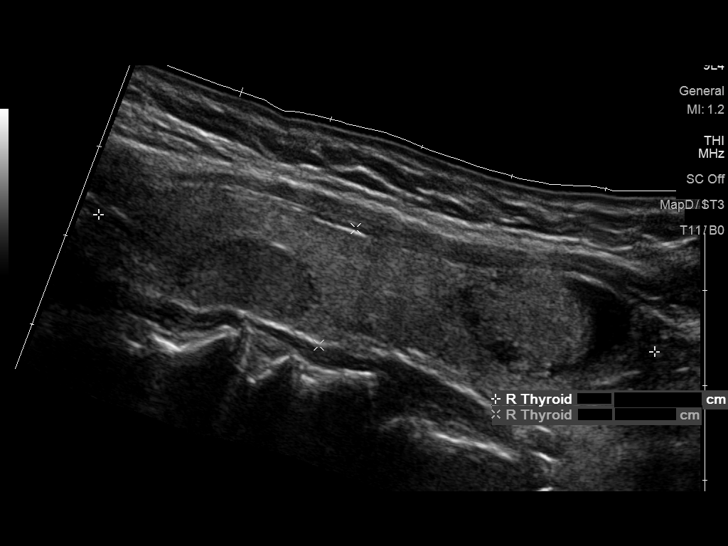
[im 7/39]
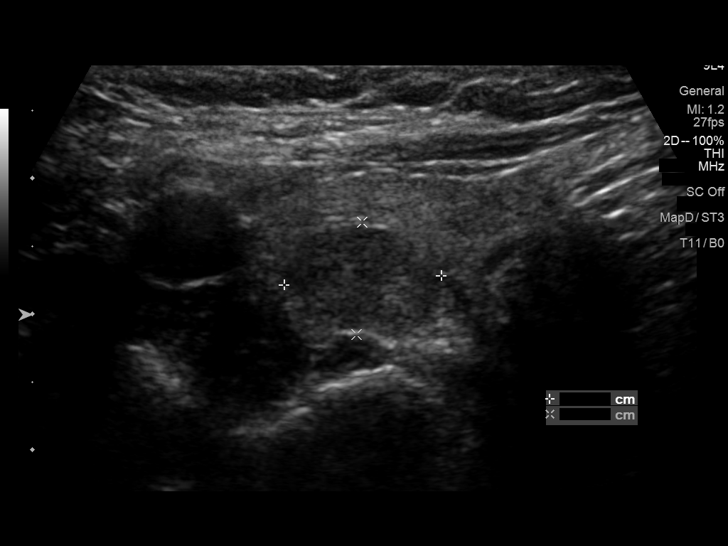
[im 10/39]
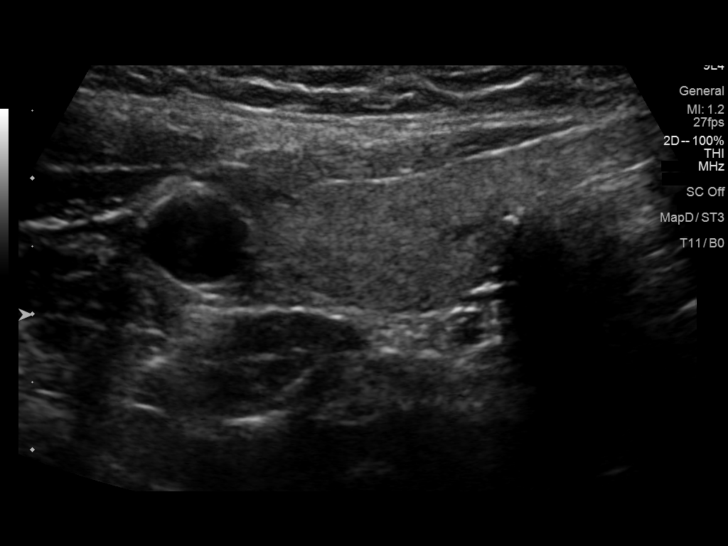
[im 13/39]
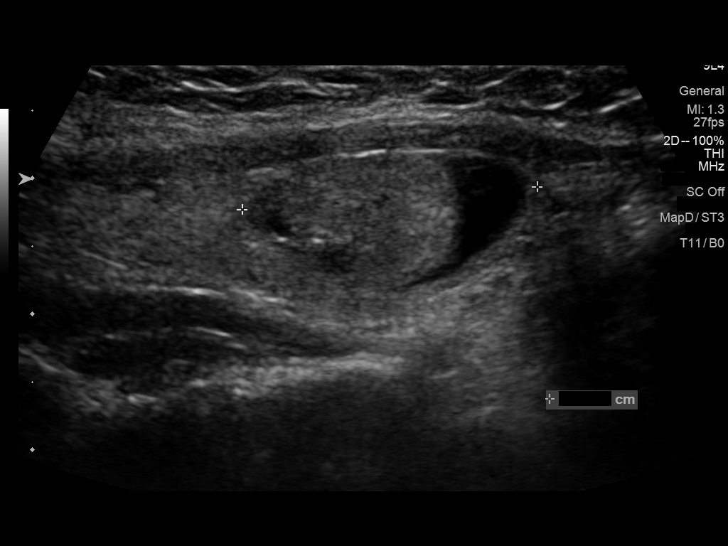
[im 16/39]
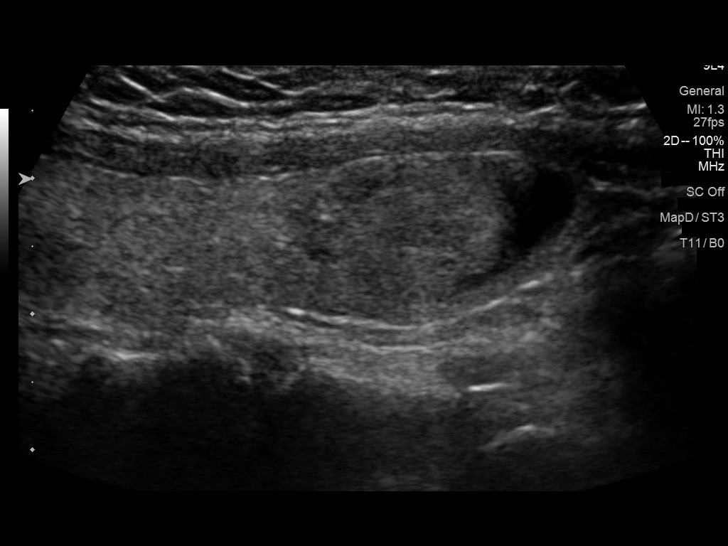
[im 20/39]
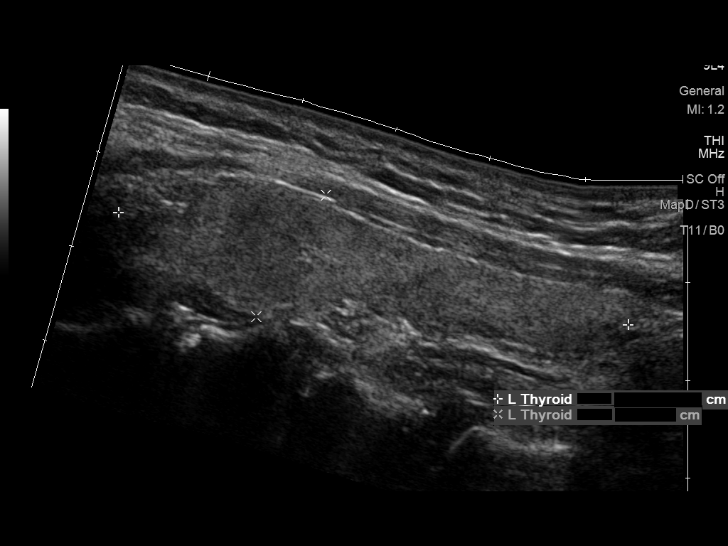
[im 23/39]
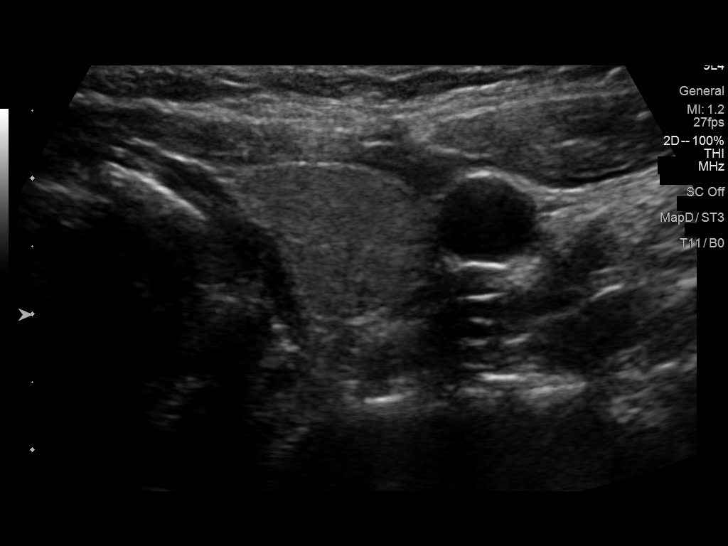
[im 26/39]
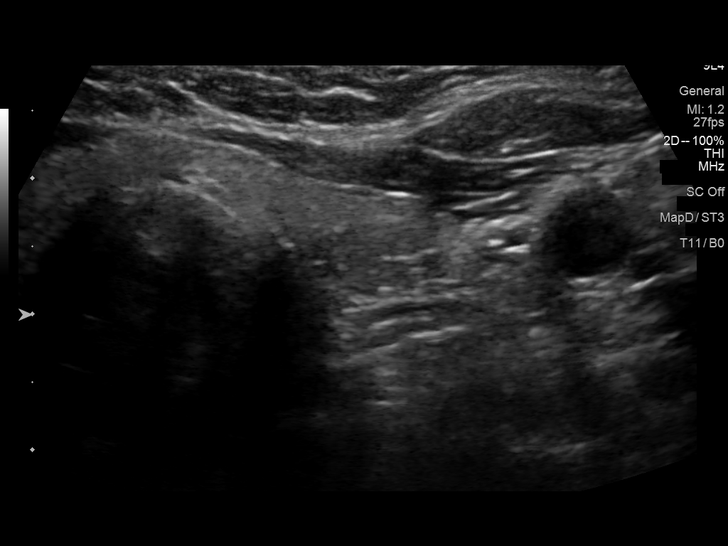
[im 29/39]
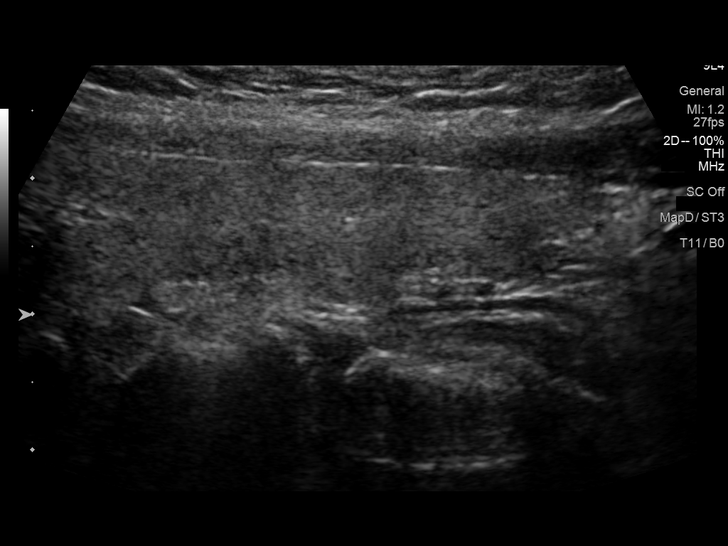
[im 32/39]
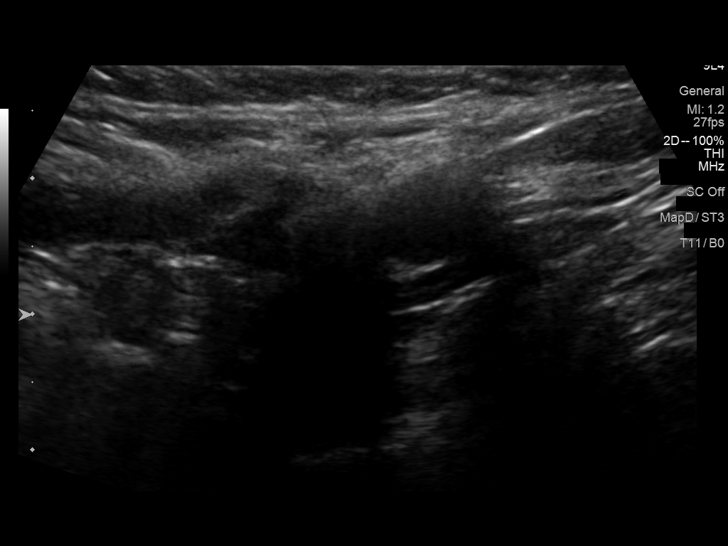
[im 35/39]
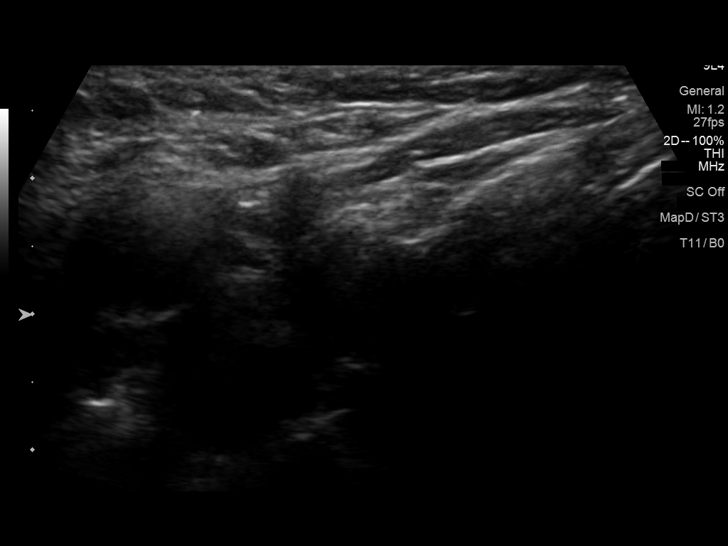
[im 39/39]
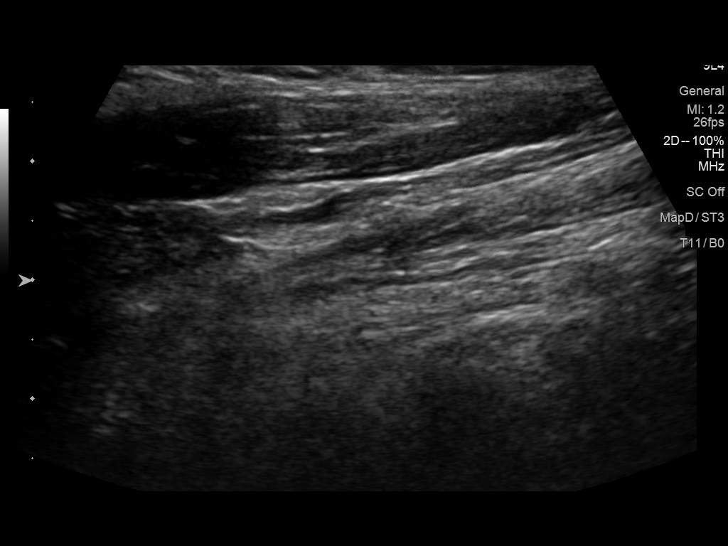

[13 of 25 positions shown; findings below may reference images not displayed]

FINDINGS: Parenchymal Echotexture: Normal

Isthmus: Normal in size measuring 0.3 cm in diameter

Right lobe: Borderline enlarged measuring 6.0 x 1.3 x 1.6 cm

Left lobe: Borderline enlarged measuring 5.3 x 1.4 x 1.7 cm

_________________________________________________________

Estimated total number of nodules >/= 1 cm: 2

Number of spongiform nodules >/=  2 cm not described below (TR1): 0

Number of mixed cystic and solid nodules >/= 1.5 cm not described
below (TR2): 0

_________________________________________________________

Nodule # 1:

Location: Right; Mid

Maximum size: 1.5 cm; Other 2 dimensions: 1.2 x 0.8 cm

Composition: solid/almost completely solid (2)

Echogenicity: hypoechoic (2)

Shape: not taller-than-wide (0)

Margins: smooth (0)

Echogenic foci: none (0)

ACR TI-RADS total points: 4.

ACR TI-RADS risk category: TR4 (4-6 points).

ACR TI-RADS recommendations:

**Given size (>/= 1.5 cm) and appearance, fine needle aspiration of
this moderately suspicious nodule should be considered based on
TI-RADS criteria.

_________________________________________________________

Nodule # 2:

Location: Right; Inferior

Maximum size: 2.2 cm; Other 2 dimensions: 1.9 x 1.1 cm

Composition: solid/almost completely solid (2)

Echogenicity: isoechoic (1)

Shape: not taller-than-wide (0)

Margins: smooth (0)

Echogenic foci: none (0)

ACR TI-RADS total points: 3.

ACR TI-RADS risk category: TR3 (3 points).

ACR TI-RADS recommendations:

*Given size (>/= 1.5 - 2.4 cm) and appearance, a follow-up
ultrasound in 1 year should be considered based on TI-RADS criteria.

_________________________________________________________

There is a punctate (approximately 0.4 cm) hypoechoic nodule/cyst
with the mid, medial aspect the left lobe of the thyroid, which does
not meet criteria to recommend percutaneous sampling or continued
dedicated follow-up.
IMPRESSION: 1. Findings suggestive of multinodular goiter.
2. Nodule #1 meets imaging criteria to recommend percutaneous
sampling as clinically indicated.
3. Nodule #2 meets imaging criteria to recommend a 1 year follow-up.

The above is in keeping with the ACR TI-RADS recommendations - [HOSPITAL] 4674;[DATE].

## 2021-10-02 IMAGING — US US FNA BIOPSY THYROID 1ST LESION
1 series · 13 of 18 positions shown · non-contrast
Comparison: Ultrasound thyroid dated March 19, 2020

MEDICATIONS:
Lidocaine 1% 2 mL

COMPLICATIONS:
None immediate.

INDICATION: Indeterminate thyroid nodule

EXAM:
ULTRASOUND GUIDED FINE NEEDLE ASPIRATION OF INDETERMINATE THYROID
NODULE
TECHNIQUE: Informed written consent was obtained from the patient after a
discussion of the risks, benefits and alternatives to treatment.
Questions regarding the procedure were encouraged and answered. A
timeout was performed prior to the initiation of the procedure.

[Series 1: us fna biopsy thyroid 1st lesion · 0.04mm/px · 18 acquisitions, 13 frames shown]
[im 1/18]
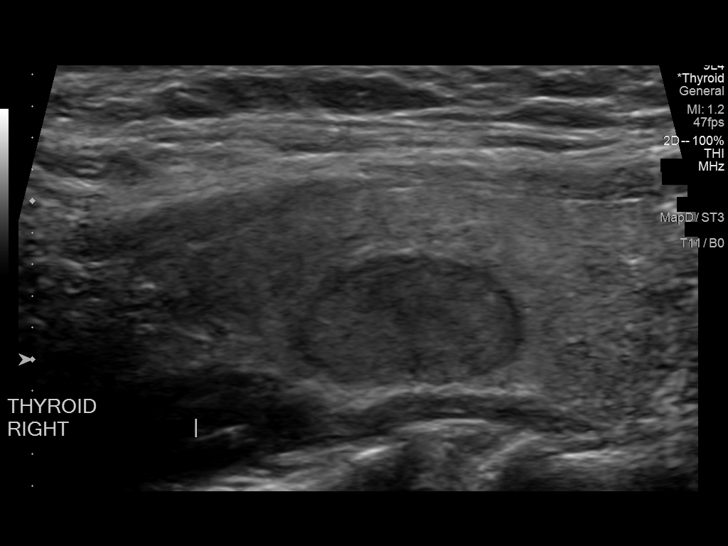
[im 3/18]
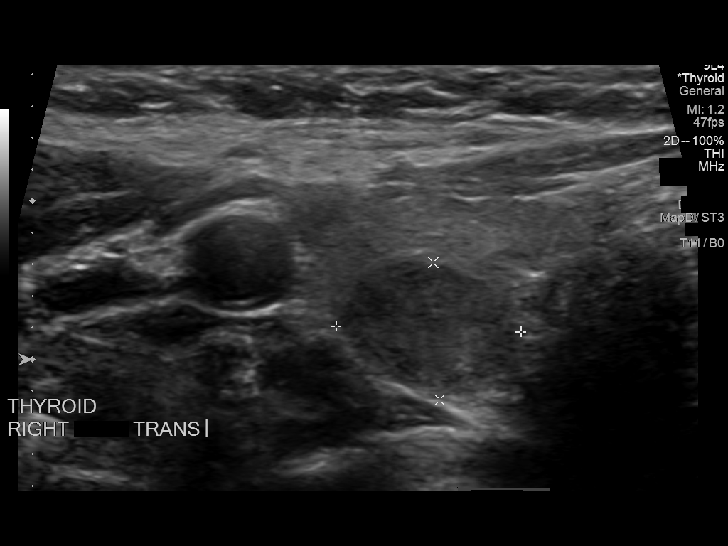
[im 4/18]
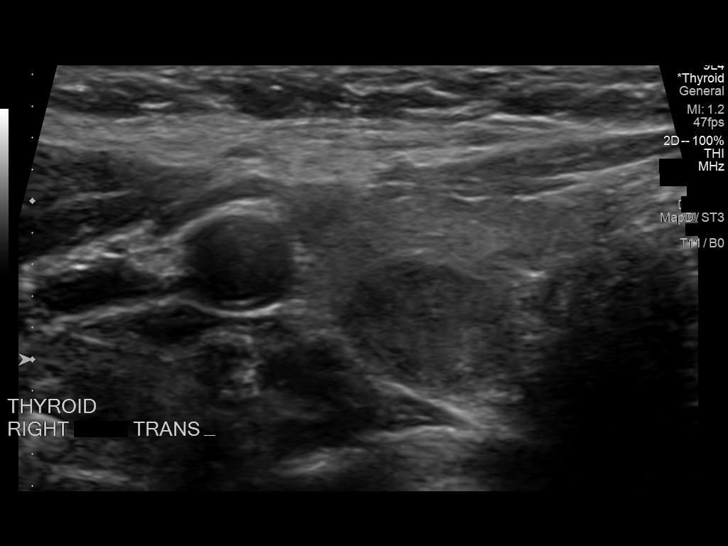
[im 5/18]
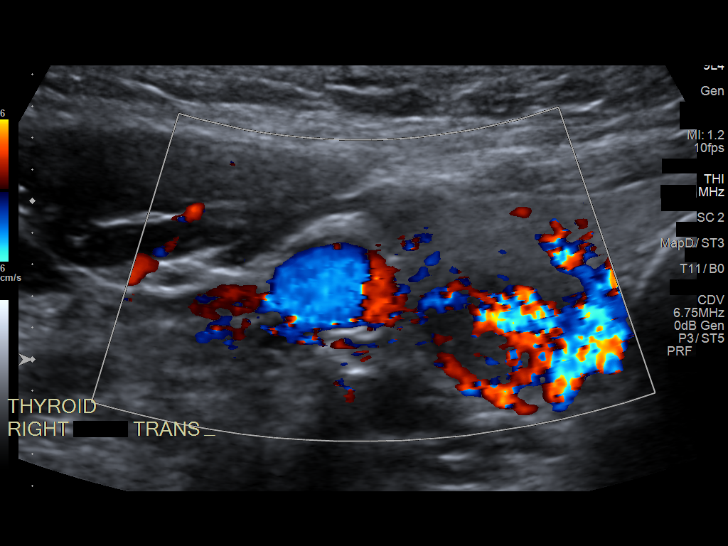
[im 7/18]
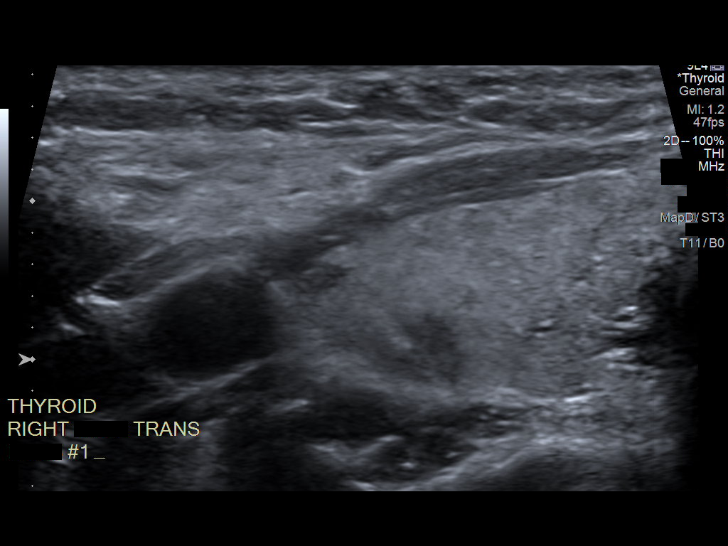
[im 8/18]
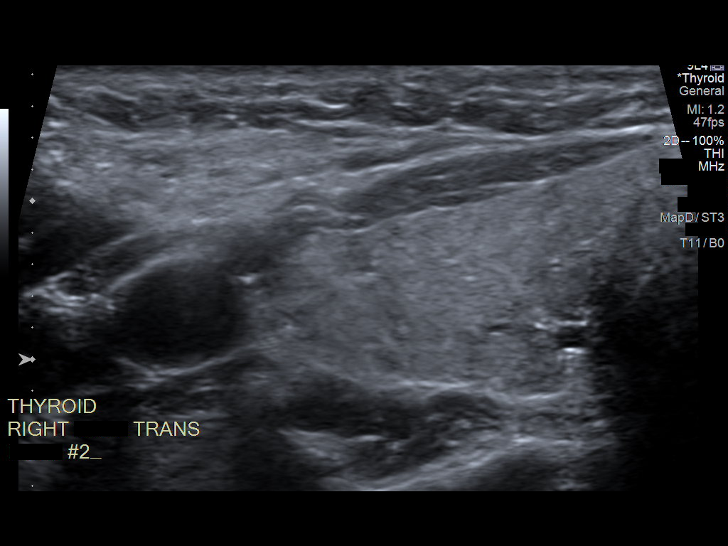
[im 10/18]
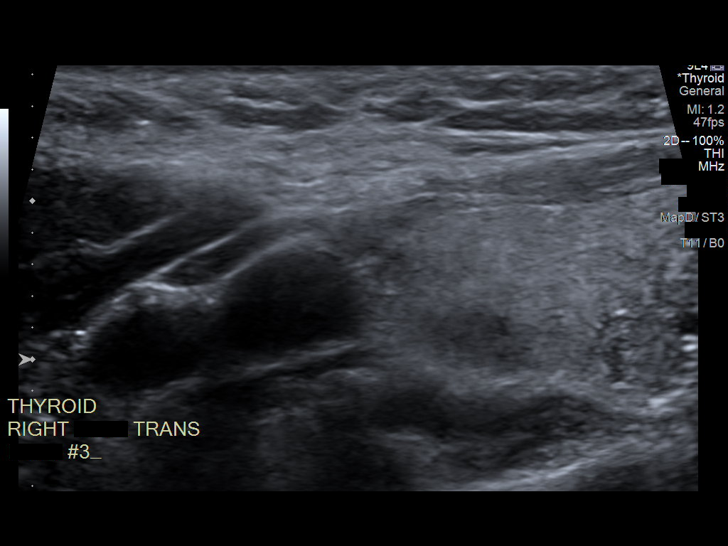
[im 11/18]
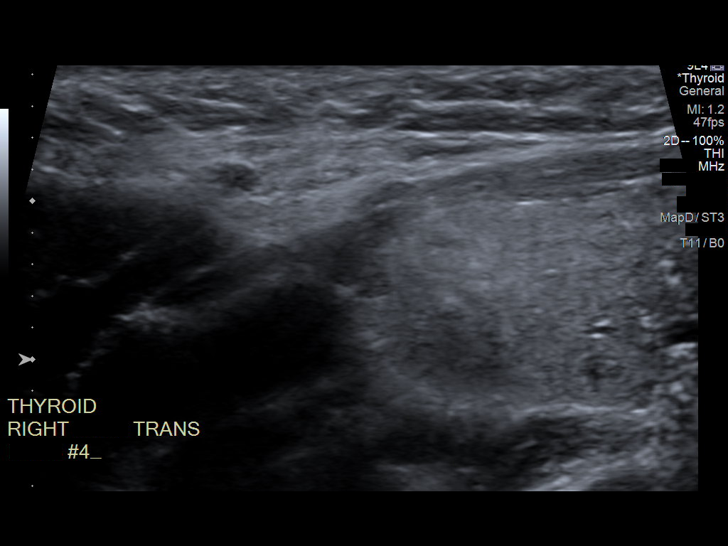
[im 12/18]
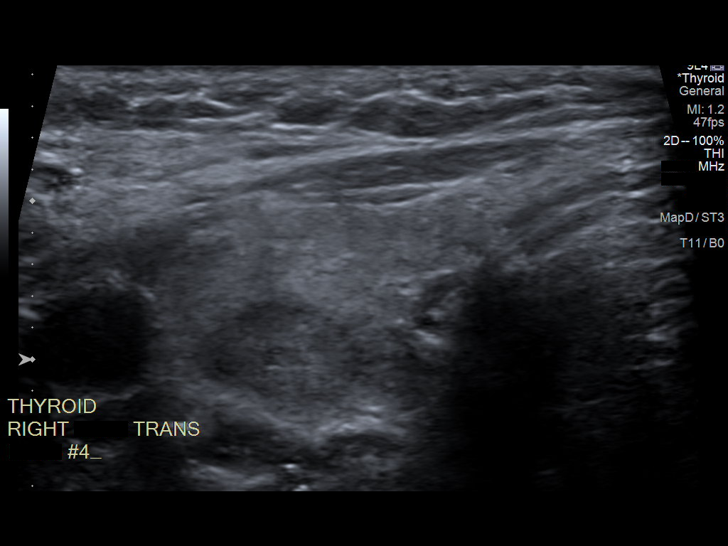
[im 14/18]
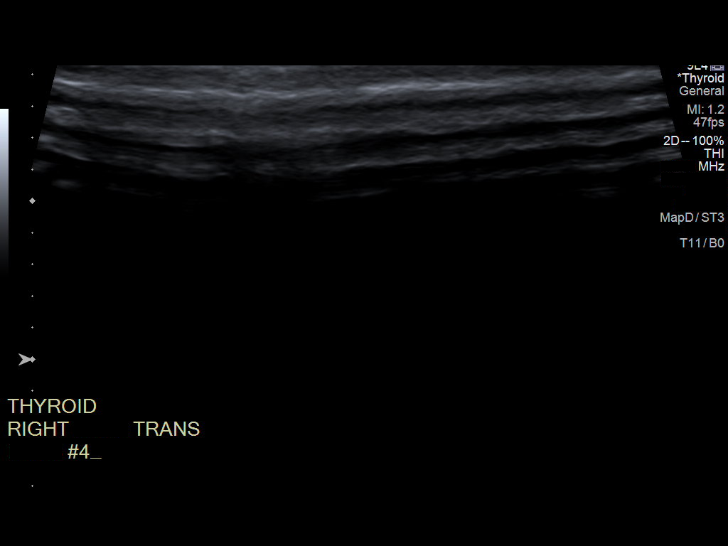
[im 15/18]
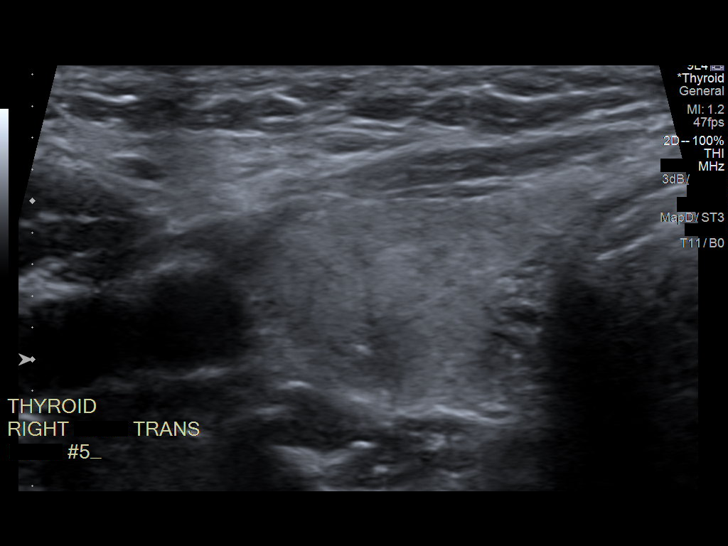
[im 16/18]
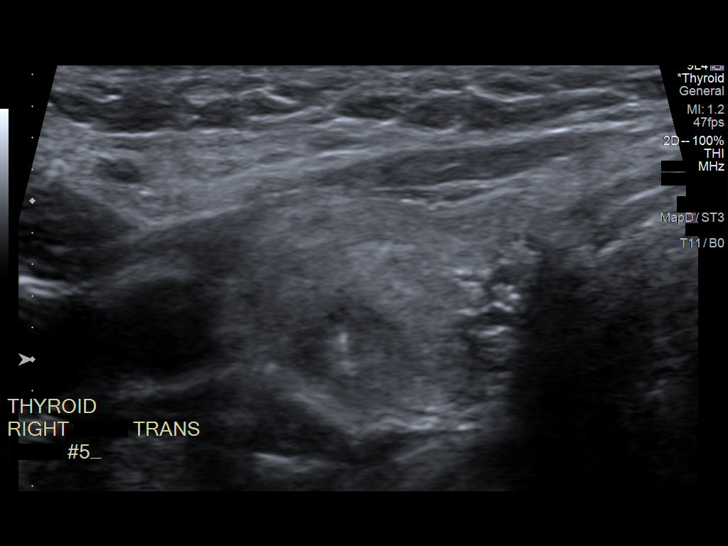
[im 18/18]
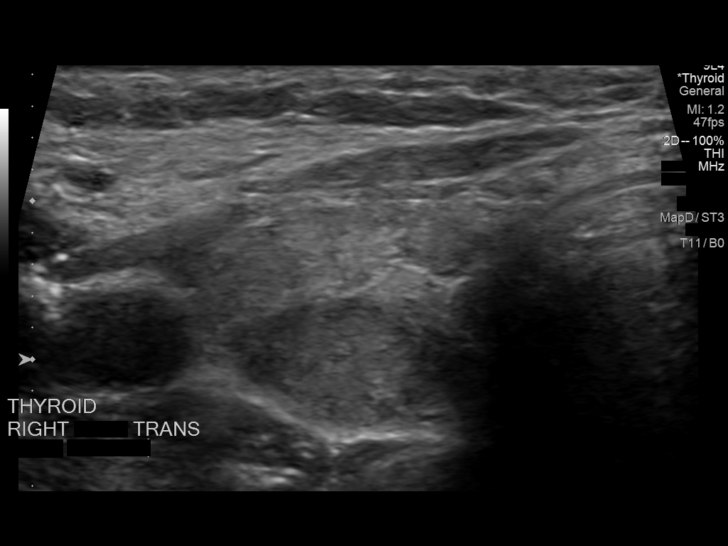

[13 of 18 positions shown; findings below may reference images not displayed]

Pre-procedural ultrasound scanning demonstrated unchanged size and
appearance of the indeterminate nodule within the right mid thyroid

The procedure was planned. The neck was prepped in the usual sterile
fashion, and a sterile drape was applied covering the operative
field. A timeout was performed prior to the initiation of the
procedure. Local anesthesia was provided with 1% lidocaine.

Under direct ultrasound guidance, 4 FNA biopsies were performed of
the right mid thyroid nodule with a 25 gauge needle.

Two samples were sent to [facility] per ordering [facility].

Multiple ultrasound images were saved for procedural documentation
purposes. The samples were prepared and submitted to pathology.

Limited post procedural scanning was negative for hematoma or
additional complication. Dressings were placed. The patient
tolerated the above procedures procedure well without immediate
postprocedural complication.
FINDINGS: Nodule reference number based on prior diagnostic ultrasound: 1

Maximum size: 1.5 cm

Location: Right; Mid

ACR TI-RADS risk category: TR4 (4-6 points)

Reason for biopsy: meets ACR TI-RADS criteria

Ultrasound imaging confirms appropriate placement of the needles
within the thyroid nodule.
IMPRESSION: Technically successful ultrasound guided fine needle aspiration of
right mid thyroid nodule

Read by: Flako Criollo, NP

## 2021-10-20 ENCOUNTER — Telehealth: Payer: Self-pay | Admitting: Podiatry

## 2021-10-20 ENCOUNTER — Other Ambulatory Visit: Payer: Self-pay | Admitting: Podiatry

## 2021-10-20 NOTE — Telephone Encounter (Signed)
No, she does not need to come into the office just for physical therapy referral.  I will place the physical therapy referral to [facility] upstairs.  They should contact her for an appointment.  Please notify patient.   Thanks, Dr. Amalia Hailey

## 2021-10-20 NOTE — Telephone Encounter (Signed)
Pt called and spoke to you at [facility] and wanted to know if she needs a appointment or if she can get a referral for PT since Dr Cannon Kettle is no longer in office.  Please advise

## 2021-11-04 ENCOUNTER — Telehealth: Payer: Self-pay | Admitting: *Deleted

## 2021-11-04 DIAGNOSIS — M79672 Pain in left foot: Secondary | ICD-10-CM

## 2021-11-04 DIAGNOSIS — M722 Plantar fascial fibromatosis: Secondary | ICD-10-CM

## 2021-11-09 NOTE — Telephone Encounter (Signed)
Error

## 2021-11-18 ENCOUNTER — Ambulatory Visit (INDEPENDENT_AMBULATORY_CARE_PROVIDER_SITE_OTHER): Payer: BC Managed Care – PPO | Admitting: Podiatry

## 2021-11-18 ENCOUNTER — Ambulatory Visit: Payer: BC Managed Care – PPO | Admitting: Podiatry

## 2021-11-18 ENCOUNTER — Ambulatory Visit (INDEPENDENT_AMBULATORY_CARE_PROVIDER_SITE_OTHER): Payer: BC Managed Care – PPO

## 2021-11-18 DIAGNOSIS — M7752 Other enthesopathy of left foot: Secondary | ICD-10-CM

## 2021-11-18 DIAGNOSIS — M79672 Pain in left foot: Secondary | ICD-10-CM

## 2021-11-18 DIAGNOSIS — M722 Plantar fascial fibromatosis: Secondary | ICD-10-CM

## 2021-11-18 MED ORDER — MELOXICAM 15 MG PO TABS: 15.0000 mg | ORAL_TABLET | Freq: Every day | ORAL | 1 refills | Status: DC

## 2021-11-18 MED ORDER — METHYLPREDNISOLONE 4 MG PO TBPK: ORAL_TABLET | ORAL | 0 refills | Status: AC

## 2021-11-18 MED ORDER — BETAMETHASONE SOD PHOS & ACET 6 (3-3) MG/ML IJ SUSP
3.0000 mg | Freq: Once | INTRAMUSCULAR | Status: AC
  Administered 2021-11-18: 3 mg via INTRA_ARTICULAR

## 2021-11-18 NOTE — Progress Notes (Signed)
   Chief Complaint  Patient presents with   Foot Pain    Left foot pain     Subjective: 51 y.o. female presenting to the office today for follow-up evaluation of plantar fasciitis of the left foot.  Patient has previously been managed and treated by Dr. Cannon Kettle who is no longer in our practice.  She states that she continues to have pain and tenderness associated to the left foot and great toe joint.  She just began physical therapy.  She is also requesting possible orthotics.   Past Medical History:  Diagnosis Date   Anemia    Endometriosis    Essential hypertension 12/18/2020   Migraine without aura, without mention of intractable migraine without mention of status migrainosus 09/25/2013   Uterine fibroid      Objective: Physical Exam General: The patient is alert and oriented x3 in no acute distress.  Dermatology: Skin is warm, dry and supple bilateral lower extremities. Negative for open lesions or macerations bilateral.   Vascular: Dorsalis Pedis and Posterior Tibial pulses palpable bilateral.  Capillary fill time is immediate to all digits.  Neurological: Epicritic and protective threshold intact bilateral.   Musculoskeletal: Tenderness to palpation to the plantar aspect of the left heel along the plantar fascia.  There is also pain on palpation and range of motion of the first MTP joint of the left foot all other joints range of motion within normal limits bilateral. Strength 5/5 in all groups bilateral.   Radiographic exam: Normal osseous mineralization. Joint spaces preserved. No fracture/dislocation/boney destruction. No other soft tissue abnormalities or radiopaque foreign bodies.   Assessment: 1. Plantar fasciitis left foot 2.  First MTP capsulitis left  Plan of Care:  1. Patient evaluated. Xrays reviewed.   2. Injection of 0.5cc Celestone soluspan injected into the left plantar fascia and the first MTP joint 3. Rx for Medrol Dose Pak placed 4. Rx for Meloxicam  ordered for patient. 5.  Patient was molded today for custom molded orthotics.  In the meantime continue good supportive running shoes 6. Instructed patient regarding therapies and modalities at home to alleviate symptoms.  7. Return to clinic in 4 weeks.     Edrick Kins, DPM [facility]  Dr. Edrick Kins, DPM    [address], [place] [address]                Office [phone]  Fax [phone]

## 2021-12-16 ENCOUNTER — Ambulatory Visit (INDEPENDENT_AMBULATORY_CARE_PROVIDER_SITE_OTHER): Payer: BC Managed Care – PPO | Admitting: Podiatry

## 2021-12-16 DIAGNOSIS — M722 Plantar fascial fibromatosis: Secondary | ICD-10-CM | POA: Diagnosis not present

## 2021-12-16 MED ORDER — BETAMETHASONE SOD PHOS & ACET 6 (3-3) MG/ML IJ SUSP
3.0000 mg | Freq: Once | INTRAMUSCULAR | Status: AC
  Administered 2021-12-16: 3 mg via INTRA_ARTICULAR

## 2021-12-16 MED ORDER — IBUPROFEN 800 MG PO TABS: 800.0000 mg | ORAL_TABLET | Freq: Three times a day (TID) | ORAL | 1 refills | Status: DC

## 2021-12-16 NOTE — Progress Notes (Signed)
   Chief Complaint  Patient presents with   Follow-up    4 week follow-up left foot plantar fasciitis.patient states that left foot still swelling.    Subjective: 51 y.o. female presenting to the office today for follow-up evaluation of plantar fasciitis of the left foot.  Patient states that the left great toe joint feels significantly better.  She has no pain or tenderness associated to the left great toe joint.  She continues to have some residual tenderness to the plantar heel but overall improvement.  Orthotics are pending.  She has been taking the meloxicam but it causes upset GI.  She also completes her physical therapy today as her last appointment.   Past Medical History:  Diagnosis Date   Anemia    Endometriosis    Essential hypertension 12/18/2020   Migraine without aura, without mention of intractable migraine without mention of status migrainosus 09/25/2013   Uterine fibroid    Past Surgical History:  Procedure Laterality Date   ABDOMINAL EXPLORATION SURGERY     HERNIA REPAIR     umbilical   KNEE ARTHROSCOPY  2013   left   MYOMECTOMY  02/2015   ROBOTIC ASSISTED TOTAL HYSTERECTOMY WITH SALPINGECTOMY Bilateral 09/13/2016   Procedure: ROBOTIC ASSISTED TOTAL HYSTERECTOMY WITH SALPINGECTOMY;  Surgeon: Servando Salina, MD;  Location: [facility];  Service: Gynecology;  Laterality: Bilateral;   No Known Allergies   Objective: Physical Exam General: The patient is alert and oriented x3 in no acute distress.  Dermatology: Skin is warm, dry and supple bilateral lower extremities. Negative for open lesions or macerations bilateral.   Vascular: Dorsalis Pedis and Posterior Tibial pulses palpable bilateral.  Capillary fill time is immediate to all digits.  Neurological: Epicritic and protective threshold intact bilateral.   Musculoskeletal: There continues to be some tenderness to palpation to the plantar aspect of the left heel along the plantar fascia.  No pain on palpation or  range of motion to the first MTP joint of the left foot.  Radiographic exam LT foot 11/18/2021: Normal osseous mineralization. Joint spaces preserved. No fracture/dislocation/boney destruction. No other soft tissue abnormalities or radiopaque foreign bodies.   Assessment: 1. Plantar fasciitis left foot 2.  First MTP capsulitis left 7: Resolved  Plan of Care:  1. Patient evaluated. Xrays reviewed.   2. Injection of 0.5cc Celestone soluspan injected into the left plantar fascia 3.  Patient is known to tolerate ibuprofen 800 mg better than meloxicam 15 mg.  Discontinue meloxicam.  Prescription for Motrin 800 mg TID 4.  Custom molded orthotics are pending dispensable 5.  Continue wearing good supportive shoes and sneakers 6.  Patient completes physical therapy this afternoon at [facility] 7.  Return to clinic in 4 weeks  Edrick Kins, DPM [facility]  Dr. Edrick Kins, DPM    [address], [place] [address]                Office [phone]  Fax [phone]

## 2021-12-26 ENCOUNTER — Telehealth: Payer: Self-pay | Admitting: Podiatry

## 2021-12-26 NOTE — Telephone Encounter (Signed)
Lvm for patient to call and schedule an appointment for orthotic uo

## 2022-01-06 ENCOUNTER — Ambulatory Visit (INDEPENDENT_AMBULATORY_CARE_PROVIDER_SITE_OTHER): Payer: BC Managed Care – PPO

## 2022-01-06 DIAGNOSIS — M722 Plantar fascial fibromatosis: Secondary | ICD-10-CM

## 2022-01-06 NOTE — Progress Notes (Signed)
Patient presents today to pick up custom molded foot orthotics, diagnosed with plantar fasciitis by Dr. Amalia Hailey.   Orthotics were dispensed and fit was satisfactory. Reviewed instructions for break-in and wear. Written instructions given to patient.  Patient will follow up as needed.   [facility] - order # [id number]

## 2022-01-13 ENCOUNTER — Ambulatory Visit (INDEPENDENT_AMBULATORY_CARE_PROVIDER_SITE_OTHER): Payer: BC Managed Care – PPO | Admitting: Podiatry

## 2022-01-13 ENCOUNTER — Other Ambulatory Visit: Payer: Self-pay | Admitting: Podiatry

## 2022-01-13 DIAGNOSIS — M722 Plantar fascial fibromatosis: Secondary | ICD-10-CM

## 2022-01-13 NOTE — Progress Notes (Signed)
   Chief Complaint  Patient presents with   Follow-up    Plantar fasciitis left foot.  Patient received orthotics about a week ago.     Subjective: 51 y.o. female presenting for follow-up evaluation of plantar fasciitis to the left heel.  Overall the patient states that she is doing well.  She has been wearing her custom orthotics for about 1 week now and there has been some improvement.  She presents for further treatment and evaluation  Past Medical History:  Diagnosis Date   Anemia    Endometriosis    Essential hypertension 12/18/2020   Migraine without aura, without mention of intractable migraine without mention of status migrainosus 09/25/2013   Uterine fibroid    Past Surgical History:  Procedure Laterality Date   ABDOMINAL EXPLORATION SURGERY     HERNIA REPAIR     umbilical   KNEE ARTHROSCOPY  2013   left   MYOMECTOMY  02/2015   ROBOTIC ASSISTED TOTAL HYSTERECTOMY WITH SALPINGECTOMY Bilateral 09/13/2016   Procedure: ROBOTIC ASSISTED TOTAL HYSTERECTOMY WITH SALPINGECTOMY;  Surgeon: Servando Salina, MD;  Location: [facility];  Service: Gynecology;  Laterality: Bilateral;   No Known Allergies   Objective: Physical Exam General: The patient is alert and oriented x3 in no acute distress.  Dermatology: Skin is warm, dry and supple bilateral lower extremities. Negative for open lesions or macerations bilateral.   Vascular: Dorsalis Pedis and Posterior Tibial pulses palpable bilateral.  Capillary fill time is immediate to all digits.  Neurological: Epicritic and protective threshold intact bilateral.   Musculoskeletal: There continues to be some tenderness to palpation to the plantar aspect of the left heel along the plantar fascia.  No pain on palpation or range of motion to the first MTP joint of the left foot.  Radiographic exam LT foot 11/18/2021: Normal osseous mineralization. Joint spaces preserved. No fracture/dislocation/boney destruction. No other soft tissue  abnormalities or radiopaque foreign bodies.   Assessment: 1. Plantar fasciitis left foot 2.  First MTP capsulitis left foot; Resolved  Plan of Care:  1. Patient evaluated.  2. Patient has completed physical therapy at [facility] 3.  Custom orthotics were dispensed about 1 week ago.  Continue wearing daily with good supportive shoes 4.  Continue new Motrin 800 mg as prescribed as needed 5.  Return to clinic as needed  Edrick Kins, DPM [facility]  Dr. Edrick Kins, DPM    [address], [place] [address]                Office [phone]  Fax [phone]

## 2022-01-13 NOTE — Telephone Encounter (Signed)
Please advise 

## 2022-02-14 ENCOUNTER — Other Ambulatory Visit: Payer: Self-pay | Admitting: Podiatry

## 2022-03-11 ENCOUNTER — Other Ambulatory Visit: Payer: Self-pay | Admitting: Podiatry

## 2022-03-11 DIAGNOSIS — M722 Plantar fascial fibromatosis: Secondary | ICD-10-CM

## 2022-04-19 ENCOUNTER — Other Ambulatory Visit: Payer: Self-pay | Admitting: Podiatry

## 2022-04-19 NOTE — Telephone Encounter (Signed)
Please advise 

## 2022-04-27 NOTE — Telephone Encounter (Signed)
Error

## 2022-05-04 ENCOUNTER — Other Ambulatory Visit: Payer: Self-pay | Admitting: Internal Medicine

## 2022-05-04 DIAGNOSIS — E042 Nontoxic multinodular goiter: Secondary | ICD-10-CM

## 2022-06-01 ENCOUNTER — Ambulatory Visit
Admission: RE | Admit: 2022-06-01 | Discharge: 2022-06-01 | Disposition: A | Discharge status: Home / Self Care | Payer: BC Managed Care – PPO | Source: Ambulatory Visit | Attending: Internal Medicine | Admitting: Internal Medicine

## 2022-06-01 DIAGNOSIS — E042 Nontoxic multinodular goiter: Secondary | ICD-10-CM

## 2022-07-02 ENCOUNTER — Other Ambulatory Visit: Payer: Self-pay | Admitting: Podiatry

## 2022-10-07 ENCOUNTER — Other Ambulatory Visit: Payer: Self-pay

## 2022-10-22 IMAGING — US US THYROID
1 series · 13 of 25 positions shown · non-contrast
Comparison: US Thyroid, most recently 04/18/2020 and 03/19/2020.

CLINICAL DATA: Prior ultrasound follow-up.

EXAM:
THYROID ULTRASOUND
TECHNIQUE: Ultrasound examination of the thyroid gland and adjacent soft
tissues was performed.

[Series 1: us thyroid · 0.05mm/px · 13 of 62 slices shown]
[im 1/62]
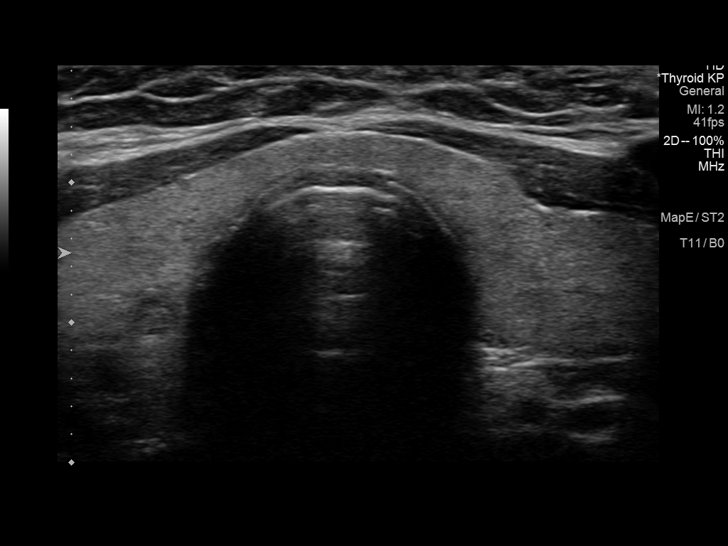
[im 6/62]
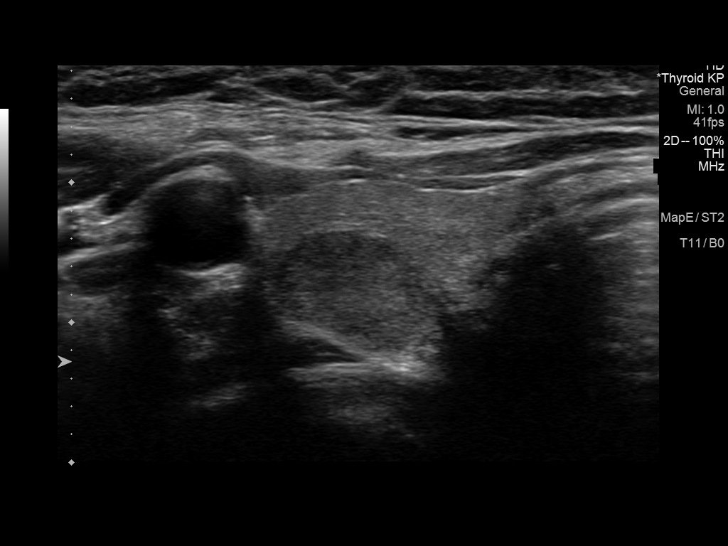
[im 11/62]
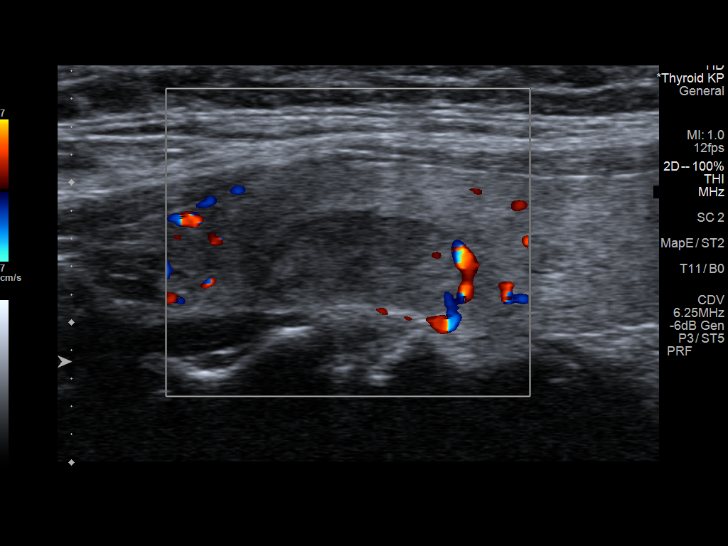
[im 16/62]
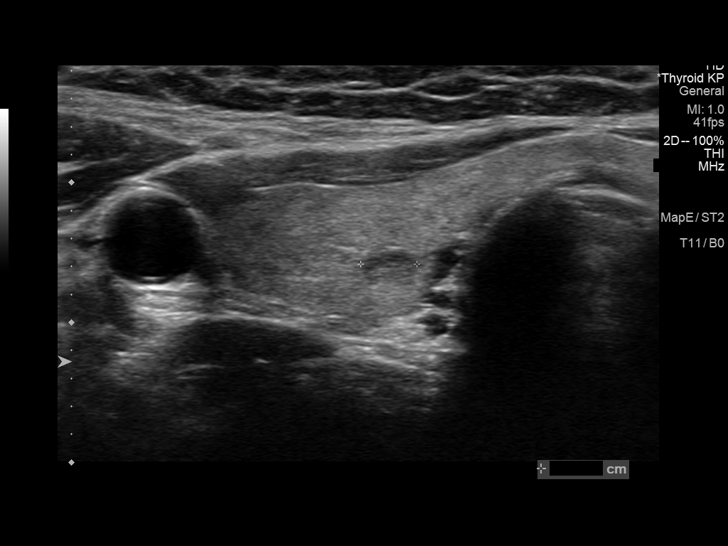
[im 21/62]
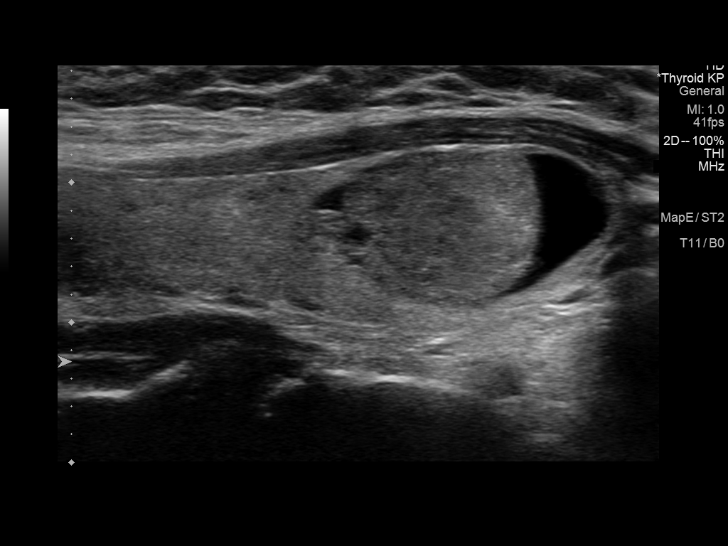
[im 26/62]
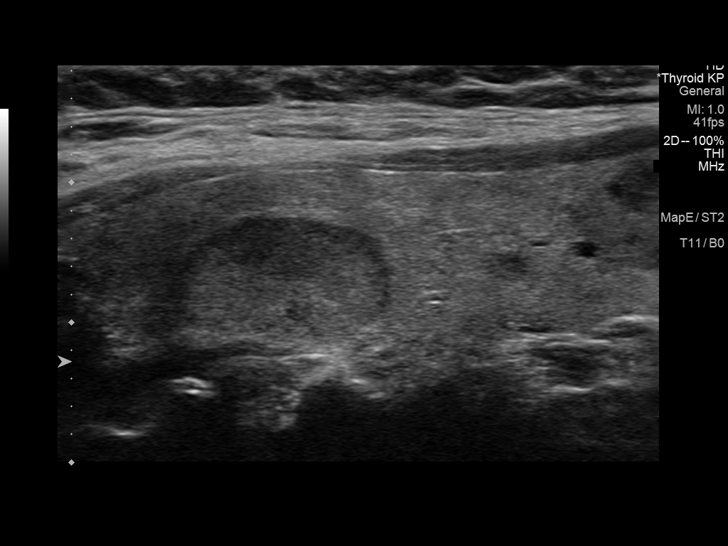
[im 31/62]
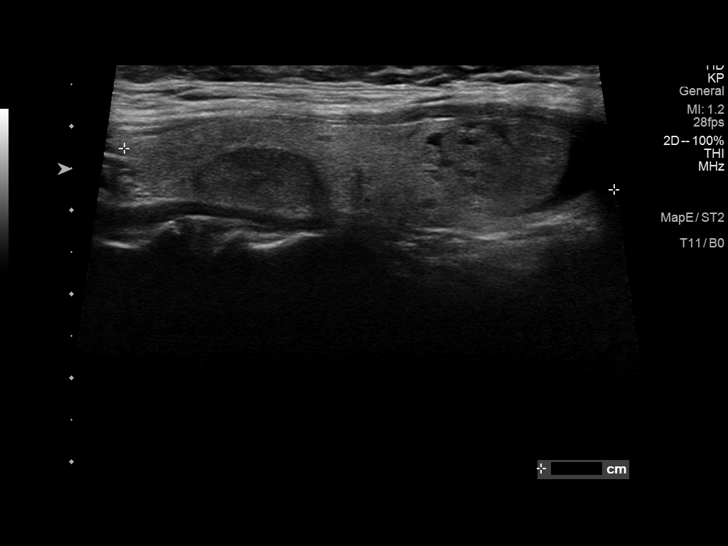
[im 36/62]
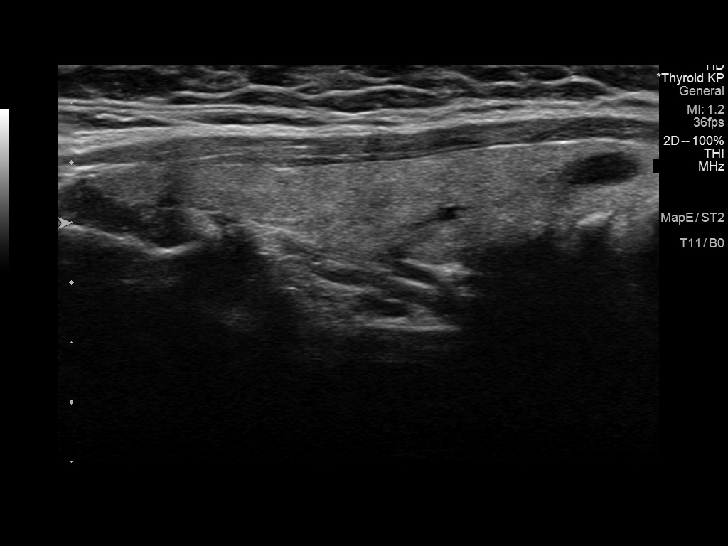
[im 41/62]
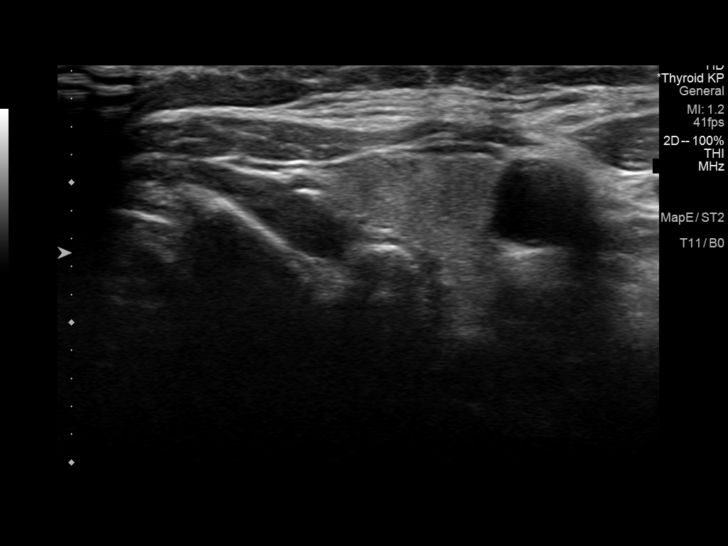
[im 46/62]
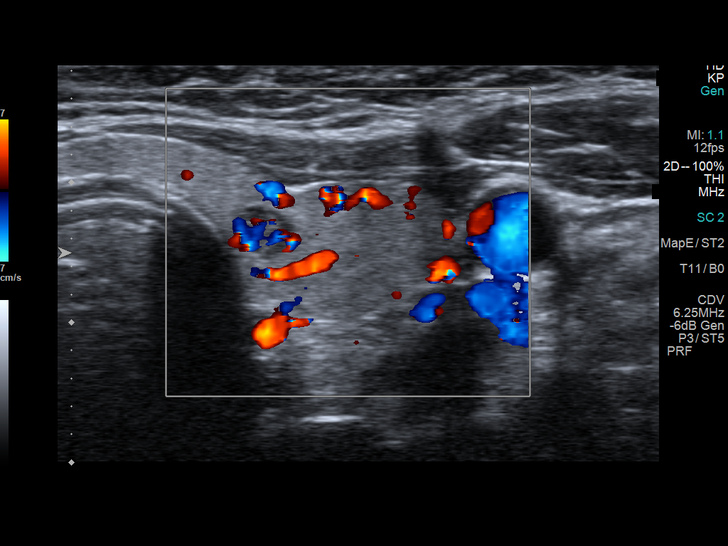
[im 51/62]
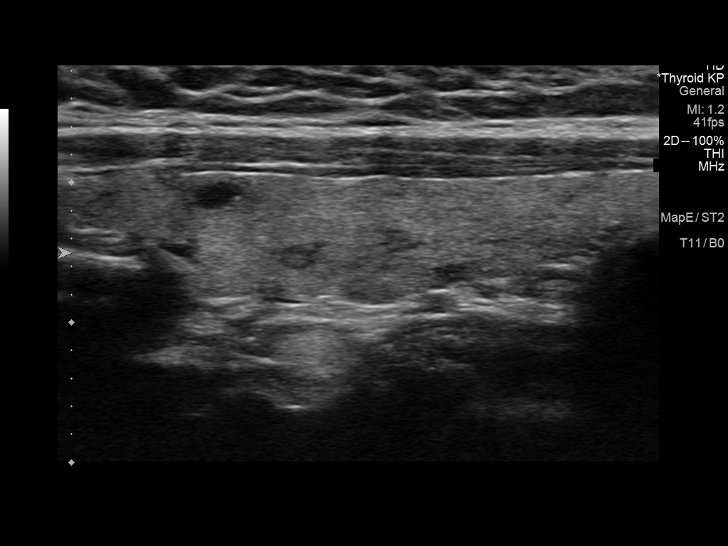
[im 56/62]
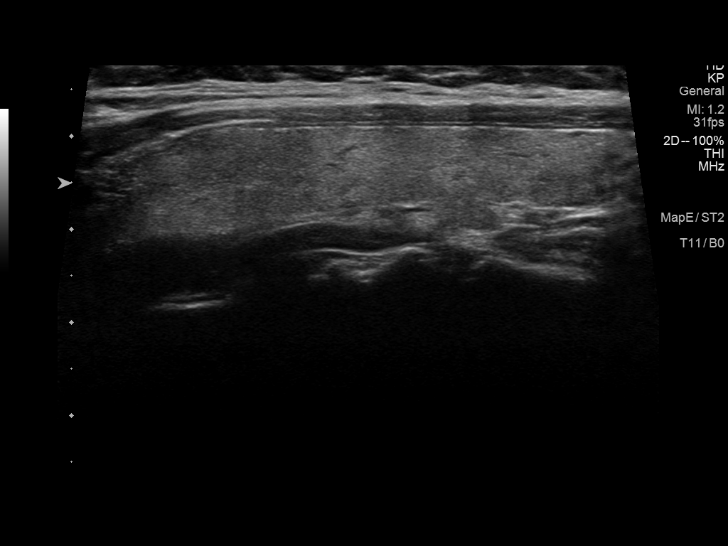
[im 62/62]
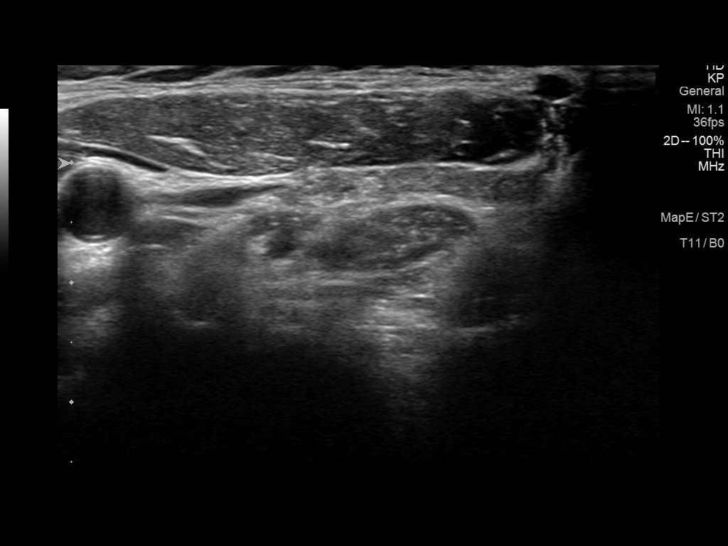

[13 of 25 positions shown; findings below may reference images not displayed]

FINDINGS: Parenchymal Echotexture: Mildly heterogenous

Isthmus: 0.3 cm

Right lobe: 5.8 x 1.4 x 1.8 cm

Left lobe: 5.7 x 1.2 x 1.7 cm

_________________________________________________________

Estimated total number of nodules >/= 1 cm: 2

Number of spongiform nodules >/=  2 cm not described below (TR1): 0

Number of mixed cystic and solid nodules >/= 1.5 cm not described
below (TR2): 0

_________________________________________________________

Nodule # 1:

Location: RIGHT; Superior

Maximum size: 1.5 cm; Other 2 dimensions: 1.1 x 0.8 cm, previously
1.5 x 1.2 x 0.8 cm.

No aggressive features on today's evaluation. This nodule appears
morphologically stable and was previously biopsied in 04/18/2020.

Assuming a benign pathologic diagnosis, repeat sampling and/or
dedicated follow-up is not recommended.

_________________________________________________________

Nodule # 2:

Location: RIGHT; Inferior

Maximum size: 2.1 cm; Other 2 dimensions: 1.7 x 1.1 cm, previously
2.2 x 1.9 x 1.1 cm

Composition: solid/almost completely solid (2)

Echogenicity: isoechoic (1)

Shape: not taller-than-wide (0)

Margins: smooth (0)

Echogenic foci: none (0)

ACR TI-RADS total points: 3.

ACR TI-RADS risk category: TR3 (3 points).

ACR TI-RADS recommendations:

*Given size (>/= 1.5 - 2.4 cm) and appearance, a follow-up
ultrasound in 1 year should be considered based on TI-RADS criteria.

_________________________________________________________

Additional smaller/subcentimeter thyroid lesions do not meet
threshold for follow-up nor biopsy per current criteria.

No cervical adenopathy or abnormal fluid collection within the
imaged neck.
IMPRESSION: 1. Multinodular RIGHT thyroid gland.
2. Stable size and appearance of 1.5 cm RIGHT superior thyroid
nodule.
This was biopsied in [DATE], assuming a benign pathologic
diagnosis, repeat sampling and/or dedicated follow-up is not
recommended.
3. 2.1 cm RIGHT inferior TR-3 thyroid nodule.
Continue follow-up with ultrasound in 1 year based on TI-RADS
criteria.

The above is in keeping with the ACR TI-RADS recommendations - [HOSPITAL] 2255;[DATE].

## 2022-12-23 ENCOUNTER — Ambulatory Visit: Payer: BC Managed Care – PPO | Admitting: Internal Medicine

## 2022-12-23 ENCOUNTER — Encounter: Payer: Self-pay | Admitting: Internal Medicine

## 2022-12-23 VITALS — BP 120/74 | HR 74 | Ht 67.0 in | Wt 193.8 lb

## 2022-12-23 DIAGNOSIS — M79671 Pain in right foot: Secondary | ICD-10-CM

## 2022-12-23 DIAGNOSIS — M79672 Pain in left foot: Secondary | ICD-10-CM

## 2022-12-23 DIAGNOSIS — E042 Nontoxic multinodular goiter: Secondary | ICD-10-CM | POA: Diagnosis not present

## 2022-12-23 LAB — T3, FREE: T3, Free: 3.1 pg/mL (ref 2.3–4.2)

## 2022-12-23 LAB — VITAMIN D 25 HYDROXY (VIT D DEFICIENCY, FRACTURES): VITD: 32.85 ng/mL (ref 30.00–100.00)

## 2022-12-23 LAB — VITAMIN B12: Vitamin B-12: 278 pg/mL (ref 211–911)

## 2022-12-23 LAB — TSH: TSH: 1.38 u[IU]/mL (ref 0.35–5.50)

## 2022-12-23 LAB — T4, FREE: Free T4: 0.79 ng/dL (ref 0.60–1.60)

## 2022-12-23 NOTE — Progress Notes (Signed)
Name: Robin Blackwell  MRN/ DOB: [id number], May 23, 1970    Age/ Sex: 52 y.o., female    PCP: Georgianne Fick, MD   Reason for Endocrinology Evaluation: MNG     Date of Initial Endocrinology Evaluation: 12/23/2022     HPI: Robin Blackwell is a 52 y.o. female with a past medical history of MNG. The patient presented for initial endocrinology clinic visit on 12/23/2022 for consultative assistance with her MNG.    She has been diagnosed with MNG in her 39's, she had a benign FNA through [facility]   Patient was diagnosed with multinodular goiter based on thyroid ultrasound 03/19/2020  She is s/p FNA of the right mid 1.5 cm nodule 2//2022 with cytology report consistent with follicular lesion of indeterminate significance (Bethesda category III), Afirma benign    Was seeing Dr. 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  Has constant feet pain, was seen by [facility]  On Vitamin D    Mother and paternal aunt with thyroid disease      HISTORY:  Past Medical History:  Past Medical History:  Diagnosis Date   Anemia    Endometriosis    Essential hypertension 12/18/2020   Migraine without aura, without mention of intractable migraine without mention of status migrainosus 09/25/2013   Uterine fibroid    Past Surgical History:  Past Surgical History:  Procedure Laterality Date   ABDOMINAL EXPLORATION SURGERY     HERNIA REPAIR     umbilical   KNEE ARTHROSCOPY  2013   left   MYOMECTOMY  02/2015   ROBOTIC ASSISTED TOTAL HYSTERECTOMY WITH SALPINGECTOMY Bilateral 09/13/2016   Procedure: ROBOTIC ASSISTED TOTAL HYSTERECTOMY WITH SALPINGECTOMY;  Surgeon: Maxie Better, MD;  Location: [facility];  Service: Gynecology;  Laterality: Bilateral;    Social History:  reports that she has never  smoked. She has never used smokeless tobacco. She reports that she does not drink alcohol and does not use drugs. Family History: family history includes Alcoholism in her father and paternal grandfather; Breast cancer in her paternal grandmother; Hypertension in her mother; Migraines in her father; Stroke in her maternal grandmother.   HOME MEDICATIONS: Allergies as of 12/23/2022   No Known Allergies      Medication List        Accurate as of December 23, 2022  9:45 AM. If you have any questions, ask your nurse or doctor.          STOP taking these medications    albuterol 108 (90 Base) MCG/ACT inhaler Commonly known as: VENTOLIN HFA Stopped by: Johnney Ou Roxane Puerto   amLODipine-atorvastatin 5-10 MG tablet Commonly known as: CADUET Stopped by: Johnney Ou Cahlil Sattar   aspirin-acetaminophen-caffeine [phone] MG tablet Commonly known as: EXCEDRIN MIGRAINE Stopped by: Johnney Ou Ellora Varnum   Azelastine HCl 137 MCG/SPRAY Soln Stopped by: Johnney Ou Alaysia Lightle   Biotin 1000 MCG tablet Stopped by: Johnney Ou Kimyata Milich   brompheniramine-pseudoephedrine-DM 30-2-10 MG/5ML syrup Stopped by: Johnney Ou Treyce Spillers   cetirizine 10 MG tablet Commonly known as: ZYRTEC Stopped by: Johnney Ou Vasco Chong   [facility] Nasal Spray 0.05 % nasal spray Generic drug: oxymetazoline Stopped by: Johnney Ou Suhayla Chisom   docusate sodium 100 MG capsule Commonly known as: COLACE Stopped by: Johnney Ou Khaleah Duer   fluticasone 50 MCG/ACT  nasal spray Commonly known as: FLONASE Stopped by: Johnney Ou Jasmond River   montelukast 10 MG tablet Commonly known as: SINGULAIR Stopped by: Johnney Ou Olivya Sobol   predniSONE 10 MG (21) Tbpk tablet Commonly known as: STERAPRED UNI-PAK 21 TAB Stopped by: Johnney Ou Logyn Kendrick   predniSONE 10 MG tablet Commonly known as: DELTASONE Stopped by: Johnney Ou Leeum Sankey   Promethazine VC/Codeine 6.25-5-10 MG/5ML Syrp Stopped by: Johnney Ou Labarron Durnin       TAKE  these medications    amLODipine 5 MG tablet Commonly known as: NORVASC Take 5 mg by mouth daily.   Clenpiq 10-3.5-12 MG-GM -GM/160ML Soln Generic drug: Sod Picosulfate-Mag Ox-Cit Acd Clenpiq 10 mg-3.5 gram-12 gram/160 mL oral solution  TAKE 160 ML ORALLY TWICE   Clenpiq 10-3.5-12 MG-GM -GM/160ML Soln Generic drug: Sod Picosulfate-Mag Ox-Cit Acd SMARTSIG:160 Milliliter(s) By Mouth Twice Daily   hydrOXYzine 25 MG tablet Commonly known as: ATARAX Take by mouth.   ibuprofen 800 MG tablet Commonly known as: ADVIL TAKE 1 TABLET BY MOUTH THREE TIMES A DAY   meloxicam 15 MG tablet Commonly known as: MOBIC TAKE 1 TABLET (15 MG TOTAL) BY MOUTH DAILY.   methylPREDNISolone 4 MG Tbpk tablet Commonly known as: MEDROL DOSEPAK 6 day dose pack - take as directed          REVIEW OF SYSTEMS: A comprehensive ROS was conducted with the patient and is negative except as per HPI    OBJECTIVE:  VS: BP 120/74 (BP Location: Right Arm, Patient Position: Sitting, Cuff Size: Small)   Pulse 74   Ht 5\' 7"  (1.702 m)   Wt 193 lb 12.8 oz (87.9 kg)   SpO2 99%   BMI 30.35 kg/m    Wt Readings from Last 3 Encounters:  12/23/22 193 lb 12.8 oz (87.9 kg)  09/22/16 186 lb (84.4 kg)  09/14/16 189 lb (85.7 kg)     EXAM: General: Pt appears well and is in NAD  Neck: General: Supple without adenopathy. Thyroid: Thyroid size normal. Unable to  appreciated any nodules today   Lungs: Clear with good BS bilat   Heart: Auscultation: RRR.  Abdomen: Soft, nontender  Extremities:  BL LE: No pretibial edema   Mental Status: Judgment, insight: Intact Orientation: Oriented to time, place, and person Mood and affect: No depression, anxiety, or agitation     DATA REVIEWED:     Latest Reference Range & Units 12/23/22 10:19  TSH 0.35 - 5.50 uIU/mL 1.38  Triiodothyronine,Free,Serum 2.3 - 4.2 pg/mL 3.1  T4,Free(Direct) 0.60 - 1.60 ng/dL 6.21    Latest Reference Range & Units 12/23/22 10:19  VITD  30.00 - 100.00 ng/mL 32.85  Vitamin B12 211 - 911 pg/mL 278     Thyroid Ultrasound 06/01/2022   Estimated total number of nodules >/= 1 cm: 2   Number of spongiform nodules >/=  2 cm not described below (TR1): 0   Number of mixed cystic and solid nodules >/= 1.5 cm not described below (TR2): 0   _________________________________________________________   Nodule # 1: No significant interval change in the size or appearance of the previously biopsied nodule in the right mid gland which measures 1.6 x 1.1 x 1.0 cm on today's study.   Nodule # 2: Similar appearance of solid isoechoic nodule in the right lower gland at 2.2 x 1.8 x 1.2 cm. Findings remain consistent with TI-RADS category 3. *Given size (>/= 1.5 - 2.4 cm) and appearance, a follow-up ultrasound in 1 year should be considered based on TI-RADS  criteria.   No new nodules or suspicious features.   IMPRESSION: 1. Unchanged appearance of previously biopsied nodule in the right mid gland. Presuming a previously benign biopsy result, no further imaging follow-up is recommended. 2. Confirmed 2 year stability of TI-RADS category 3 nodule in the right lower gland. This nodule continues to meet criteria for imaging surveillance. Recommend follow-up ultrasound in 1-2 years.   The above is in keeping with the ACR TI-RADS recommendations - J Am Coll Radiol 2017;14:587-595.     FNA right mid nodule 04/18/2020    Clinical History: Nodule 1; right mid; 1.5x 1.2x 0.8 cm; solid/almost  completely solid; hypoechoic; TI-RADS points: 4  Specimen Submitted:  A. THYROID, RMP, FINE NEEDLE ASPIRATION:    FINAL MICROSCOPIC DIAGNOSIS:  - Follicular lesion of undetermined significance (Bethesda category III)   Afirma benign  ASSESSMENT/PLAN/RECOMMENDATIONS:   Multinodular Goiter:  -No local neck symptoms  - Pt is clinically euthyroid  -S/p FNA of the right mid nodule in 2022 with follicular lesion of undetermined significance  (Bethesda category III) with benign Afirma - An order for repeat ultrasound was ordered for 05/2023     2. Feet Pain :   -She has been evaluated by podiatry as well as rheumatology for this -I did encourage the patient to start strengthening exercises -Vitamin D and B12 are within normal range, patient will be advised to start OTC vitamin B12 1000 mcg daily   Follow-up in 1 year  Signed electronically by: Lyndle Herrlich, MD  [facility] Endocrinology  [facility] [address] [place] [place], [place] [address] Phone: [phone] FAX: [phone]   CC: Georgianne Fick, MD [address] [place] [address] Phone: [phone] Fax: [phone]   Return to Endocrinology clinic as below: No future appointments.

## 2023-01-07 ENCOUNTER — Ambulatory Visit
Admission: RE | Admit: 2023-01-07 | Discharge: 2023-01-07 | Disposition: A | Discharge status: Home / Self Care | Payer: BC Managed Care – PPO | Source: Ambulatory Visit | Attending: Internal Medicine | Admitting: Internal Medicine

## 2023-01-07 DIAGNOSIS — E042 Nontoxic multinodular goiter: Secondary | ICD-10-CM

## 2023-01-20 ENCOUNTER — Telehealth: Payer: Self-pay | Admitting: Internal Medicine

## 2023-01-20 DIAGNOSIS — E042 Nontoxic multinodular goiter: Secondary | ICD-10-CM

## 2023-01-20 NOTE — Telephone Encounter (Signed)
Please let the patient know that her most recent thyroid ultrasound continues to show thyroid nodules, one of the nodules on the right has increased in size and it does need to be biopsied.   Please take permission from the patient to proceed with a biopsy, the patient has had a history of prior thyroid nodule biopsies she is familiar with the process.   Let me know so I can order the biopsy, thanks

## 2023-01-20 NOTE — Telephone Encounter (Signed)
 Patient would like to have biopsy ordered.

## 2023-02-03 ENCOUNTER — Other Ambulatory Visit (HOSPITAL_COMMUNITY)
Admission: RE | Admit: 2023-02-03 | Discharge: 2023-02-03 | Disposition: A | Discharge status: Home / Self Care | Payer: BC Managed Care – PPO | Source: Ambulatory Visit | Attending: Internal Medicine | Admitting: Internal Medicine

## 2023-02-03 ENCOUNTER — Ambulatory Visit
Admission: RE | Admit: 2023-02-03 | Discharge: 2023-02-03 | Disposition: A | Discharge status: Home / Self Care | Payer: BC Managed Care – PPO | Source: Ambulatory Visit | Attending: Internal Medicine | Admitting: Internal Medicine

## 2023-02-03 DIAGNOSIS — E041 Nontoxic single thyroid nodule: Secondary | ICD-10-CM

## 2023-02-03 DIAGNOSIS — E042 Nontoxic multinodular goiter: Secondary | ICD-10-CM

## 2023-02-08 LAB — CYTOLOGY - NON PAP

## 2023-03-01 ENCOUNTER — Encounter (HOSPITAL_COMMUNITY): Payer: Self-pay

## 2023-03-02 ENCOUNTER — Encounter: Payer: Self-pay | Admitting: Internal Medicine

## 2023-05-06 ENCOUNTER — Other Ambulatory Visit: Payer: Self-pay | Admitting: Internal Medicine

## 2023-05-06 DIAGNOSIS — E041 Nontoxic single thyroid nodule: Secondary | ICD-10-CM

## 2023-05-16 ENCOUNTER — Telehealth: Payer: Self-pay | Admitting: Internal Medicine

## 2023-05-16 NOTE — Telephone Encounter (Signed)
 Patient is calling to say ask about her thyroid ultrasound.  Patient states that Dr. Carolyn Stare office is ordering an ultrasound too and she wants to know who the orders should come from for the same procedure.

## 2023-05-16 NOTE — Telephone Encounter (Signed)
 Patient states that she had a [place] in November and that Dr. Nicholos Johns office order and [place]. She wants to know if she needs to repeat this again and if you will be taking over her care for the thyroid completely.

## 2023-05-16 NOTE — Telephone Encounter (Signed)
 LDTVM! She can have US done by Dr. Nicholos Johns office and notify us once done and she will be able to see results

## 2023-05-16 NOTE — Telephone Encounter (Signed)
 Patient aware.

## 2023-12-22 ENCOUNTER — Telehealth: Payer: Self-pay

## 2023-12-22 NOTE — Telephone Encounter (Signed)
 Patient called to see if her appt can be moved to 10/13 vs the 14th. Patient requesting a callback

## 2023-12-23 NOTE — Telephone Encounter (Signed)
 Sent to Admin to check schedule

## 2023-12-26 ENCOUNTER — Ambulatory Visit: Payer: BC Managed Care – PPO | Admitting: Internal Medicine

## 2023-12-27 ENCOUNTER — Encounter: Payer: Self-pay | Admitting: Internal Medicine

## 2023-12-27 ENCOUNTER — Ambulatory Visit (INDEPENDENT_AMBULATORY_CARE_PROVIDER_SITE_OTHER): Admitting: Internal Medicine

## 2023-12-27 ENCOUNTER — Other Ambulatory Visit

## 2023-12-27 ENCOUNTER — Ambulatory Visit: Admitting: Internal Medicine

## 2023-12-27 VITALS — BP 126/74 | HR 80 | Ht 67.0 in | Wt 201.6 lb

## 2023-12-27 DIAGNOSIS — E042 Nontoxic multinodular goiter: Secondary | ICD-10-CM | POA: Diagnosis not present

## 2023-12-27 NOTE — Progress Notes (Unsigned)
 Name: Robin Blackwell  MRN/ DOB: [id number], 09-Dec-1970    Age/ Sex: 53 y.o., female    PCP: Verdia Lombard, MD   Reason for Endocrinology Evaluation: MNG     Date of Initial Endocrinology Evaluation: 12/27/2023     HPI: Robin Blackwell is a 53 y.o. female with a past medical history of MNG. The patient presented for initial endocrinology clinic visit on 12/27/2023 for consultative assistance with her MNG.    She has been diagnosed with MNG in her 43's, she had a benign FNA through [facility]   Patient was diagnosed with multinodular goiter based on thyroid  ultrasound 03/19/2020  She is s/p FNA of the right mid 1.5 cm nodule 2//2022 with cytology report consistent with follicular lesion of indeterminate significance (Bethesda category III), Afirma benign  She is s/p FNA of the right inferior 2.6 nodule in November, 2024 with a cytology report consistent with follicular lesion of indeterminate significance, Afirma benign    Was seeing Dr. 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  Has constant feet pain, was seen by [facility]  On Vitamin D     Mother and paternal aunt with thyroid  disease    SUBJECTIVE:    Today (12/27/23):  Robin Blackwell is here for follow-up on multinodular goiter.  No local neck swelling  No palpitations  Denies tremors  Has noted recent hoarseness  that she attributes to changes in season  Has occasional changes in bowel movements Patient does follow with dermatology for alopecia, continues with hair loss     HISTORY:  Past Medical History:  Past Medical History:  Diagnosis Date   Anemia    Endometriosis    Essential hypertension 12/18/2020   Migraine without aura, without mention of intractable migraine without mention of status migrainosus 09/25/2013   Uterine fibroid    Past Surgical History:  Past Surgical History:  Procedure Laterality Date   ABDOMINAL EXPLORATION SURGERY      HERNIA REPAIR     umbilical   KNEE ARTHROSCOPY  2013   left   MYOMECTOMY  02/2015   ROBOTIC ASSISTED TOTAL HYSTERECTOMY WITH SALPINGECTOMY Bilateral 09/13/2016   Procedure: ROBOTIC ASSISTED TOTAL HYSTERECTOMY WITH SALPINGECTOMY;  Surgeon: Rutherford Gain, MD;  Location: [facility];  Service: Gynecology;  Laterality: Bilateral;    Social History:  reports that she has never smoked. She has never used smokeless tobacco. She reports that she does not drink alcohol and does not use drugs. Family History: family history includes Alcoholism in her father and paternal grandfather; Breast cancer in her paternal grandmother; Hypertension in her mother; Migraines in her father; Stroke in her maternal grandmother.   HOME MEDICATIONS: Allergies as of 12/27/2023   No Known Allergies      Medication List        Accurate as of December 27, 2023 10:03 AM. If you have any questions, ask your nurse or doctor.          amLODipine 5 MG tablet Commonly known as: NORVASC Take 5 mg by mouth daily.   Clenpiq 10-3.5-12 MG-GM -GM/160ML Soln Generic drug: Sod Picosulfate-Mag Ox-Cit Acd Clenpiq 10 mg-3.5 gram-12 gram/160 mL oral solution  TAKE 160 ML ORALLY TWICE   Clenpiq 10-3.5-12 MG-GM -GM/160ML Soln Generic drug: Sod Picosulfate-Mag Ox-Cit Acd SMARTSIG:160 Milliliter(s) By Mouth Twice Daily   hydrOXYzine 25 MG tablet Commonly known as: ATARAX Take by mouth.   ibuprofen  800  MG tablet Commonly known as: ADVIL  TAKE 1 TABLET BY MOUTH THREE TIMES A DAY   meloxicam  15 MG tablet Commonly known as: MOBIC  TAKE 1 TABLET (15 MG TOTAL) BY MOUTH DAILY.   methylPREDNISolone  4 MG Tbpk tablet Commonly known as: MEDROL  DOSEPAK 6 day dose pack - take as directed   minoxidil 2.5 MG tablet Commonly known as: LONITEN Take by mouth.          REVIEW OF SYSTEMS: A comprehensive ROS was conducted with the patient and is negative except as per HPI    OBJECTIVE:  VS: BP 126/74 (BP Location: Left  Arm, Patient Position: Sitting, Cuff Size: Large)   Pulse 80   Ht 5' 7 (1.702 m)   Wt 201 lb 9.6 oz (91.4 kg)   SpO2 96%   BMI 31.58 kg/m    Wt Readings from Last 3 Encounters:  12/27/23 201 lb 9.6 oz (91.4 kg)  12/23/22 193 lb 12.8 oz (87.9 kg)  09/22/16 186 lb (84.4 kg)     EXAM: General: Pt appears well and is in NAD  Neck: General: Supple without adenopathy. Thyroid :Unable to  appreciated any nodules today   Lungs: Clear with good BS bilat   Heart: Auscultation: RRR.  Abdomen: Soft, nontender  Extremities:  BL LE: No pretibial edema   Mental Status: Judgment, insight: Intact Orientation: Oriented to time, place, and person Mood and affect: No depression, anxiety, or agitation     DATA REVIEWED:      Thyroid  Ultrasound 01/07/2023    Narrative & Impression  CLINICAL DATA:  Prior ultrasound follow-up.   EXAM: THYROID  ULTRASOUND   TECHNIQUE: Ultrasound examination of the thyroid  gland and adjacent soft tissues was performed.   COMPARISON:  03/19/2020, 04/18/2020, 05/08/2021   FINDINGS: Parenchymal Echotexture: Mildly heterogeneous   Isthmus: 0.3 cm ,previously 0.3 cm   Right lobe: 6.2 x 1.7 x 1.7 cm ,previously 4.7 x 1.6 x 1.0 cm   Left lobe: 5.2 x 1.1 x 1.9 cm ,previously 5.3 x 1.3 x 1.5 cm   ________________________________________________________   Estimated total number of nodules >/= 1 cm: 3   Number of spongiform nodules >/=  2 cm not described below (TR1): 0   Number of mixed cystic and solid nodules >/= 1.5 cm not described below (TR2): 0   _________________________________________________________   Slight interval increased size but otherwise similar sonographic features of previously biopsied right mid solid thyroid  nodule (labeled 1, 1.8 cm, previously 1.6 cm).   Nodule # 2:   Prior biopsy: No   Location: Right; Inferior   Maximum size: 2.6 cm; Other 2 dimensions: 2.4 x 1.5 cm, previously, 2.2 x 1.8 x 1.2 cm    Composition: mixed cystic and solid (1)   Echogenicity: isoechoic (1)   Shape: not taller-than-wide (0)   Margins: smooth (0)   Echogenic foci: none (0)   ACR TI-RADS total points: 2.   ACR TI-RADS risk category:  TR2 (2 points).   Significant change in size (>/= 20% in two dimensions and minimal increase of 2 mm): Yes   Change in features: No   Change in ACR TI-RADS risk category: No   ACR TI-RADS recommendations:   **Given size (>/= 2.5 cm) and appearance, fine needle aspiration of this mildly suspicious nodule should be considered based on TI-RADS criteria.   _________________________________________________________   Slight interval increased size of spongiform nodules in the left hemi thyroid  which again appear benign and do not warrant additional follow-up.   No cervical  lymphadenopathy.   IMPRESSION: 1. Similar appearing multinodular thyroid  gland. 2. Slight interval increased size but otherwise similar sonographic features of previously biopsied right mid solid thyroid  nodule (labeled 1, 1.8 cm, previously 1.6 cm). Recommend correlation with prior biopsy results. 3. Interval increased size of cystic component of previously visualized solid cystic nodule in the right inferior thyroid  (labeled 2, 2.6 cm, previously 2.2 cm). While this nodule now technically meets criteria (TI-RADS category 3) for tissue sampling, the enlargement of the cystic component portends benignity thus one year surveillance could also be considered.     FNA right mid nodule 04/18/2020    Clinical History: Nodule 1; right mid; 1.5x 1.2x 0.8 cm; solid/almost  completely solid; hypoechoic; TI-RADS points: 4  Specimen Submitted:  A. THYROID , RMP, FINE NEEDLE ASPIRATION:    FINAL MICROSCOPIC DIAGNOSIS:  - Follicular lesion of undetermined significance (Bethesda category III)   Afirma benign     FNA Right inferior Nodule 02/03/2023   Clinical History: Nodule # 2: Right;  Inferior, Maximum size: 2.6 cm; Other 2 dimensions: 2.4 x 1.5 cm, previously, 2.2 x 1.8 x 1.2 cm, mixed cystic and solid, isoechoic, TI-RADS total points: 2. Specimen Submitted:  A. THYROID , RT INFERIOR, FINE NEEDLE ASPIRATION   FINAL MICROSCOPIC DIAGNOSIS: - Atypia of undetermined significance (Bethesda category III) - See comment    Afirma Benign   ASSESSMENT/PLAN/RECOMMENDATIONS:   Multinodular Goiter:  -Patient remains clinically euthyroid -No local neck symptoms -S/p FNA of the right mid nodule in 2022 with follicular lesion of undetermined significance (Bethesda category III) with benign Afirma - She is S/P FNA of the right inferior nodule 2.6 cm in 2024 with follicular lesion of undetermined significance (Bethesda category III) with benign Afirma. - Her last FNA was painful, and she wishes not to have any more FNAs   Follow-up in 1 year  Signed electronically by: Stefano Redgie Butts, MD  [facility] Endocrin[facility]  [facility] [address] [place] [place], [place] [address] Phone: [phone] FAX: [phone]   CC: Verdia Lombard, MD [address] [place] [address] Phone: [phone] Fax: [phone]   Return to Endocrinology clinic as below: No future appointments.

## 2023-12-28 ENCOUNTER — Ambulatory Visit: Payer: Self-pay | Admitting: Internal Medicine

## 2023-12-28 LAB — TSH: TSH: 1.47 m[IU]/L

## 2023-12-28 LAB — T4, FREE: Free T4: 1 ng/dL (ref 0.8–1.8)

## 2023-12-29 ENCOUNTER — Ambulatory Visit
Admission: RE | Admit: 2023-12-29 | Discharge: 2023-12-29 | Disposition: A | Discharge status: Home / Self Care | Source: Ambulatory Visit | Attending: Internal Medicine | Admitting: Internal Medicine

## 2023-12-29 DIAGNOSIS — E042 Nontoxic multinodular goiter: Secondary | ICD-10-CM

## 2025-01-07 ENCOUNTER — Ambulatory Visit: Admitting: Internal Medicine
